# Patient Record
Sex: Female | Born: 1950 | Hispanic: No | Marital: Married | Smoking: Never smoker
Health system: Southern US, Community
[De-identification: ages and names within clinical notes are randomized; demographics above are authoritative.]

## PROBLEM LIST (undated history)

## (undated) DIAGNOSIS — T7840XA Allergy, unspecified, initial encounter: Secondary | ICD-10-CM

## (undated) DIAGNOSIS — E785 Hyperlipidemia, unspecified: Secondary | ICD-10-CM

## (undated) DIAGNOSIS — K219 Gastro-esophageal reflux disease without esophagitis: Secondary | ICD-10-CM

## (undated) DIAGNOSIS — N3010 Interstitial cystitis (chronic) without hematuria: Secondary | ICD-10-CM

## (undated) DIAGNOSIS — G709 Myoneural disorder, unspecified: Secondary | ICD-10-CM

## (undated) DIAGNOSIS — R011 Cardiac murmur, unspecified: Secondary | ICD-10-CM

## (undated) DIAGNOSIS — Q211 Atrial septal defect, unspecified: Secondary | ICD-10-CM

## (undated) DIAGNOSIS — M1990 Unspecified osteoarthritis, unspecified site: Secondary | ICD-10-CM

## (undated) DIAGNOSIS — H5000 Unspecified esotropia: Secondary | ICD-10-CM

## (undated) HISTORY — DX: Atrial septal defect: Q21.1

## (undated) HISTORY — DX: Cardiac murmur, unspecified: R01.1

## (undated) HISTORY — DX: Atrial septal defect, unspecified: Q21.10

## (undated) HISTORY — DX: Interstitial cystitis (chronic) without hematuria: N30.10

## (undated) HISTORY — DX: Myoneural disorder, unspecified: G70.9

## (undated) HISTORY — PX: EYE SURGERY: SHX253

## (undated) HISTORY — DX: Unspecified osteoarthritis, unspecified site: M19.90

## (undated) HISTORY — DX: Allergy, unspecified, initial encounter: T78.40XA

## (undated) HISTORY — DX: Hyperlipidemia, unspecified: E78.5

## (undated) HISTORY — DX: Gastro-esophageal reflux disease without esophagitis: K21.9

---

## 1984-10-06 HISTORY — PX: ASD REPAIR: SHX258

## 1998-04-23 ENCOUNTER — Ambulatory Visit: Admission: RE | Admit: 1998-04-23 | Discharge: 1998-04-23 | Payer: Self-pay | Admitting: Gastroenterology

## 1998-04-25 ENCOUNTER — Other Ambulatory Visit: Admission: RE | Admit: 1998-04-25 | Discharge: 1998-04-25 | Payer: Self-pay | Admitting: Obstetrics & Gynecology

## 1999-05-15 ENCOUNTER — Other Ambulatory Visit: Admission: RE | Admit: 1999-05-15 | Discharge: 1999-05-15 | Payer: Self-pay | Admitting: Obstetrics & Gynecology

## 2000-05-11 ENCOUNTER — Other Ambulatory Visit: Admission: RE | Admit: 2000-05-11 | Discharge: 2000-05-11 | Payer: Self-pay | Admitting: Obstetrics & Gynecology

## 2000-10-06 HISTORY — PX: CERVICAL SPINE SURGERY: SHX589

## 2001-05-11 ENCOUNTER — Other Ambulatory Visit: Admission: RE | Admit: 2001-05-11 | Discharge: 2001-05-11 | Payer: Self-pay | Admitting: Obstetrics & Gynecology

## 2001-08-19 ENCOUNTER — Encounter: Payer: Self-pay | Admitting: Neurosurgery

## 2001-08-20 ENCOUNTER — Ambulatory Visit (HOSPITAL_COMMUNITY): Admission: RE | Admit: 2001-08-20 | Discharge: 2001-08-20 | Payer: Self-pay | Admitting: Neurosurgery

## 2001-08-20 ENCOUNTER — Encounter: Payer: Self-pay | Admitting: Neurosurgery

## 2001-10-04 ENCOUNTER — Encounter: Payer: Self-pay | Admitting: Neurosurgery

## 2001-10-04 ENCOUNTER — Ambulatory Visit (HOSPITAL_COMMUNITY): Admission: RE | Admit: 2001-10-04 | Discharge: 2001-10-04 | Payer: Self-pay | Admitting: Neurosurgery

## 2001-11-01 DIAGNOSIS — K430 Incisional hernia with obstruction, without gangrene: Secondary | ICD-10-CM | POA: Insufficient documentation

## 2002-01-04 ENCOUNTER — Encounter: Admission: RE | Admit: 2002-01-04 | Discharge: 2002-01-04 | Payer: Self-pay | Admitting: Family Medicine

## 2002-01-04 ENCOUNTER — Encounter (INDEPENDENT_AMBULATORY_CARE_PROVIDER_SITE_OTHER): Payer: Self-pay | Admitting: *Deleted

## 2002-01-04 ENCOUNTER — Encounter: Payer: Self-pay | Admitting: Family Medicine

## 2002-01-19 ENCOUNTER — Encounter: Admission: RE | Admit: 2002-01-19 | Discharge: 2002-01-19 | Payer: Self-pay | Admitting: General Surgery

## 2002-01-19 ENCOUNTER — Encounter (INDEPENDENT_AMBULATORY_CARE_PROVIDER_SITE_OTHER): Payer: Self-pay | Admitting: *Deleted

## 2002-01-19 ENCOUNTER — Encounter: Payer: Self-pay | Admitting: General Surgery

## 2002-05-05 ENCOUNTER — Encounter (INDEPENDENT_AMBULATORY_CARE_PROVIDER_SITE_OTHER): Payer: Self-pay | Admitting: Specialist

## 2002-05-05 ENCOUNTER — Ambulatory Visit (HOSPITAL_COMMUNITY): Admission: RE | Admit: 2002-05-05 | Discharge: 2002-05-05 | Payer: Self-pay | Admitting: *Deleted

## 2002-05-05 ENCOUNTER — Encounter (INDEPENDENT_AMBULATORY_CARE_PROVIDER_SITE_OTHER): Payer: Self-pay | Admitting: *Deleted

## 2003-04-17 ENCOUNTER — Other Ambulatory Visit: Admission: RE | Admit: 2003-04-17 | Discharge: 2003-04-17 | Payer: Self-pay | Admitting: Obstetrics & Gynecology

## 2004-04-30 ENCOUNTER — Other Ambulatory Visit: Admission: RE | Admit: 2004-04-30 | Discharge: 2004-04-30 | Payer: Self-pay | Admitting: Obstetrics & Gynecology

## 2004-07-02 ENCOUNTER — Encounter (INDEPENDENT_AMBULATORY_CARE_PROVIDER_SITE_OTHER): Payer: Self-pay | Admitting: *Deleted

## 2004-07-02 ENCOUNTER — Ambulatory Visit (HOSPITAL_BASED_OUTPATIENT_CLINIC_OR_DEPARTMENT_OTHER): Admission: RE | Admit: 2004-07-02 | Discharge: 2004-07-02 | Payer: Self-pay | Admitting: Urology

## 2004-07-02 ENCOUNTER — Ambulatory Visit (HOSPITAL_COMMUNITY): Admission: RE | Admit: 2004-07-02 | Discharge: 2004-07-02 | Payer: Self-pay | Admitting: Urology

## 2005-05-09 ENCOUNTER — Other Ambulatory Visit: Admission: RE | Admit: 2005-05-09 | Discharge: 2005-05-09 | Payer: Self-pay | Admitting: Obstetrics & Gynecology

## 2007-10-01 ENCOUNTER — Encounter: Admission: RE | Admit: 2007-10-01 | Discharge: 2007-10-01 | Payer: Self-pay | Admitting: Family Medicine

## 2007-10-07 HISTORY — PX: CARPAL TUNNEL RELEASE: SHX101

## 2007-12-10 ENCOUNTER — Ambulatory Visit (HOSPITAL_COMMUNITY): Admission: RE | Admit: 2007-12-10 | Discharge: 2007-12-10 | Payer: Self-pay | Admitting: Neurosurgery

## 2008-03-22 ENCOUNTER — Encounter: Admission: RE | Admit: 2008-03-22 | Discharge: 2008-03-22 | Payer: Self-pay | Admitting: Neurosurgery

## 2008-03-24 ENCOUNTER — Encounter: Admission: RE | Admit: 2008-03-24 | Discharge: 2008-03-24 | Payer: Self-pay | Admitting: Neurosurgery

## 2008-10-31 ENCOUNTER — Encounter: Admission: RE | Admit: 2008-10-31 | Discharge: 2009-01-02 | Payer: Self-pay | Admitting: Family Medicine

## 2009-10-06 DIAGNOSIS — N3010 Interstitial cystitis (chronic) without hematuria: Secondary | ICD-10-CM

## 2009-10-06 HISTORY — DX: Interstitial cystitis (chronic) without hematuria: N30.10

## 2010-01-02 ENCOUNTER — Encounter (INDEPENDENT_AMBULATORY_CARE_PROVIDER_SITE_OTHER): Payer: Self-pay | Admitting: *Deleted

## 2010-03-27 ENCOUNTER — Ambulatory Visit: Payer: Self-pay | Admitting: Gastroenterology

## 2010-03-27 DIAGNOSIS — F411 Generalized anxiety disorder: Secondary | ICD-10-CM | POA: Insufficient documentation

## 2010-03-27 DIAGNOSIS — R198 Other specified symptoms and signs involving the digestive system and abdomen: Secondary | ICD-10-CM

## 2010-03-27 DIAGNOSIS — K219 Gastro-esophageal reflux disease without esophagitis: Secondary | ICD-10-CM

## 2010-03-27 DIAGNOSIS — R1031 Right lower quadrant pain: Secondary | ICD-10-CM | POA: Insufficient documentation

## 2010-04-16 ENCOUNTER — Ambulatory Visit: Payer: Self-pay | Admitting: Gastroenterology

## 2010-04-19 ENCOUNTER — Encounter: Payer: Self-pay | Admitting: Gastroenterology

## 2010-11-07 NOTE — Letter (Signed)
Summary: [facility] Instructions  [facility]  [address] [place], [place] [address]   Phone: [phone]  Fax: [phone]       Deborah Archer    14-Jul-1951    MRN: [id number]        Procedure Day /Date: 04/01/10 Monday     Arrival Time: 1:30 pm     Procedure Time: 2:30 pm     Location of Procedure:                    _x_  [facility] (4th Floor)  PREPARATION FOR COLONOSCOPY WITH MOVIPREP   Starting 5 days prior to your procedure (03/27/10) do not eat nuts, seeds, popcorn, corn, beans, peas,  salads, or any raw vegetables.  Do not take any fiber supplements (e.g. Metamucil, Citrucel, and Benefiber).  THE DAY BEFORE YOUR PROCEDURE         DATE: 03/31/10  DAY: Sunday  1.  Drink clear liquids the entire day-NO SOLID FOOD  2.  Do not drink anything colored red or purple.  Avoid juices with pulp.  No orange juice.  3.  Drink at least 64 oz. (8 glasses) of fluid/clear liquids during the day to prevent dehydration and help the prep work efficiently.  CLEAR LIQUIDS INCLUDE: Water Jello Ice Popsicles Tea (sugar ok, no milk/cream) Powdered fruit flavored drinks Coffee (sugar ok, no milk/cream) Gatorade Juice: apple, white grape, white cranberry  Lemonade Clear bullion, consomm, broth Carbonated beverages (any kind) Strained chicken noodle soup Hard Candy                             4.  In the morning, mix first dose of MoviPrep solution:    Empty 1 Pouch A and 1 Pouch B into the disposable container    Add lukewarm drinking water to the top line of the container. Mix to dissolve    Refrigerate (mixed solution should be used within 24 hrs)  5.  Begin drinking the prep at 5:00 p.m. The MoviPrep container is divided by 4 marks.   Every 15 minutes drink the solution down to the next mark (approximately 8 oz) until the full liter is complete.   6.  Follow completed prep with 16 oz of clear liquid of your choice (Nothing red or purple).   Continue to drink clear liquids until bedtime.  7.  Before going to bed, mix second dose of MoviPrep solution:    Empty 1 Pouch A and 1 Pouch B into the disposable container    Add lukewarm drinking water to the top line of the container. Mix to dissolve    Refrigerate  THE DAY OF YOUR PROCEDURE      DATE: 04/01/10 DAY: Monday  Beginning at 9:30 a.m. (5 hours before procedure):         1. Every 15 minutes, drink the solution down to the next mark (approx 8 oz) until the full liter is complete.  2. Follow completed prep with 16 oz. of clear liquid of your choice.    3. You may drink clear liquids until 12:30 pm (2 HOURS BEFORE PROCEDURE).   MEDICATION INSTRUCTIONS  Unless otherwise instructed, you should take regular prescription medications with a small sip of water   as early as possible the morning of your procedure.          OTHER INSTRUCTIONS  You will need a responsible adult at least 61  years of age to accompany you and drive you home.   This person must remain in the waiting room during your procedure.  Wear loose fitting clothing that is easily removed.  Leave jewelry and other valuables at home.  However, you may wish to bring a book to read or  an iPod/MP3 player to listen to music as you wait for your procedure to start.  Remove all body piercing jewelry and leave at home.  Total time from sign-in until discharge is approximately 2-3 hours.  You should go home directly after your procedure and rest.  You can resume normal activities the  day after your procedure.  The day of your procedure you should not:   Drive   Make legal decisions   Operate machinery   Drink alcohol   Return to work  You will receive specific instructions about eating, activities and medications before you leave.    The above instructions have been reviewed and explained to me by  Lamona Curl CMA Duncan Dull)  March 27, 2010 12:08 PM     I fully understand and can  verbalize these instructions _____________________________ Date 03/27/10

## 2010-11-07 NOTE — Assessment & Plan Note (Signed)
Summary: discuss colon--ch.   History of Present Illness Visit Type: Initial Visit Primary GI MD: Elie Goody MD [facility] Primary Provider: Shaune Pollack, MD Chief Complaint: Patient here to establish GI care her last ECL was with Dr. Luther Parody at [facility] in 2003. She had some RLQ pain which has since resolved. She also has hemorrhoids that flare up from time to time. She notes a change in her BMs.  History of Present Illness:   This is a 60 year old female, who relates a lifelong history of a "sensitive stomach". She states stress and certain foods lead to multiple urgent bowel movements and for several weeks she has had more urgent and frequent bowel movements, along with hemorrhoidal swelling and discomfort. She feels she is under no more stress than usual but was under more stress when the change first occured.  She had right lower quadrant pain that lasted for a few week and radiated around her right flank into her back that resolved several weeks ago. She has since been evaluated by Dr. Marcelyn Bruins at [facility] and he felt she was having a flare of her interstitial cystitis that lead to RLQ pain.  She has chronic GERD that is under good control on Prevacid OTC. She underwent colonoscopy in July 2003 that was normal. She underwent upper endoscopy in July 2003, that showed only a benign fundic gland polyp.   GI Review of Systems    Reports abdominal pain and  acid reflux.     Location of  Abdominal pain: RLQ.    Denies belching, bloating, chest pain, dysphagia with liquids, dysphagia with solids, heartburn, loss of appetite, nausea, vomiting, vomiting blood, weight loss, and  weight gain.      Reports change in bowel habits, constipation, and  hemorrhoids.     Denies anal fissure, black tarry stools, diarrhea, diverticulosis, fecal incontinence, heme positive stool, irritable bowel syndrome, jaundice, light color stool, liver problems, rectal bleeding, and  rectal pain. Preventive  Screening-Counseling & Management  Alcohol-Tobacco     Smoking Status: never      Drug Use:  no.     Current Medications (verified): 1)  Valium 5 Mg Tabs (Diazepam) .... Take 1/2 Tablet By Mouth At Bedtime 2)  Prevacid 24hr 15 Mg Cpdr (Lansoprazole) .... Take One By Mouth As Needed 3)  Tums Smoothies 750 Mg Chew (Calcium Carbonate Antacid) .... Take Up To 6 By Mouth As Needed 4)  Prelief 65-340-50 Mg Tabs (Calcium Glycerophosphate) .... Take 2-4 With Meals As Needed 5)  Tylenol 325 Mg Tabs (Acetaminophen) .... Take One- Two Tabs By Mouth Once Daily 6)  Alka-Seltzer Antacid 500 Mg Caps (Calcium Carbonate Antacid) .... Take Two By Mouth As Needed  Allergies (verified): 1)  ! Amoxicillin 2)  ! 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  Alcohol Use - yes wine cooler every 6 months Daily Caffeine Use 2-3 glasses tea or soda a day Illicit Drug Use - no Smoking Status:  never Drug Use:  no  Review of Systems       The patient complains of anxiety-new, change in vision,  urination - excessive, and urine leakage.         The pertinent positives and negatives are noted as above and in the HPI. All other ROS were reviewed and were negative.   Vital Signs:  Patient profile:   60 year old female Height:      63.5 inches Weight:      170.4 pounds BMI:     29.82 Pulse rate:   80 / minute Pulse rhythm:   regular BP sitting:   122 / 80  (left arm) Cuff size:   regular  Vitals Entered By: Harlow Mares CMA Duncan Dull) (March 27, 2010 11:36 AM)  Physical Exam  General:  Well developed, well nourished, no acute distress. Head:  Normocephalic and atraumatic. Eyes:  PERRLA, no icterus. Ears:  Normal auditory acuity. Mouth:  No deformity or lesions, dentition normal. Neck:  Supple; no masses or thyromegaly. Lungs:  Clear throughout to auscultation. Heart:  Regular rate and rhythm; no murmurs, rubs,  or bruits. Abdomen:  Soft, nontender and nondistended. No masses, hepatosplenomegaly or hernias noted. Normal bowel sounds. Rectal:  deferred until time of colonoscopy.   Msk:  Symmetrical with no gross deformities. Normal posture. Pulses:  Normal pulses noted. Extremities:  No clubbing, cyanosis, edema or deformities noted. Neurologic:  Alert and  oriented x4;  grossly normal neurologically. Cervical Nodes:  No significant cervical adenopathy. Inguinal Nodes:  No significant inguinal adenopathy. Psych:  Alert and cooperative. Normal mood and anxious.    Impression & Recommendations:  Problem # 1:  CHANGE IN BOWELS (ICD-787.99) Change in bowel habits. I suspect she has irritable bowel syndrome. Given her recent right lower quadrant pain and persistent change in bowel habits we need to exclude inflammatory bowel disease and colorectal neoplasms. The risks, benefits and alternatives to colonoscopy with possible biopsy and possible polypectomy were discussed with the patient and they consent to proceed. The procedure will be scheduled electively.  Problem # 2:  ABDOMINAL PAIN-RLQ (ICD-789.03) Symptoms have resolved. I suspect they were related to interstitial cystitis, but gastrointestinal causes will be further evaluated, as above.  Problem # 3:  GERD (ICD-530.81) Continue standard antireflux measures and Prevacid 15 mg q.a.m.  Problem # 4:  ANXIETY (ICD-300.00) Further followup and management per Dr. Kevan Ny.  Other Orders: Colonoscopy (Colon)  Patient Instructions: 1)  You have been scheduled for a colonoscopy on  04/01/10 @ 2:30 pm. Please arrive at 1:30 pm for registration. 2)  Please pick up your Moviprep prescription at the pharmacy. We have sent this prescription electronically. 3)  Avoid foods high in acid content ( tomatoes, citrus juices, spicy foods) . Avoid eating within 3 to 4 hours of lying down or before exercising. Do not over eat; try smaller more frequent meals. Elevate head of bed four inches when sleeping.  4)  Antireflux Diet Handout Given. 5)  Copy sent to : Dr Shaune Pollack 6)  The medication list was reviewed and reconciled.  All changed / newly prescribed medications were explained.  A complete medication list was provided to the patient / caregiver. Prescriptions: MOVIPREP 100 GM  SOLR (PEG-KCL-NACL-NASULF-NA ASC-C) As per prep instructions.  #1 x 0   Entered by:   Lamona Curl CMA (AAMA)   Authorized by:   Meryl Dare MD [facility]   Signed by:   Lamona Curl CMA (AAMA) on 03/27/2010   Method used:   Electronically to        [facility]  [address] [phone]* (retail)  [address]       [place], [place]  [address]       Ph: [phone] or [phone]       Fax: [phone]   RxID:   [id number]

## 2010-11-07 NOTE — Procedures (Signed)
Summary: Colonoscopy  Patient: Deborah Archer Note: All result statuses are Final unless otherwise noted.  Tests: (1) Colonoscopy (COL)   COL Colonoscopy           DONE     [facility]     [address]     [place], [place]  [address]           COLONOSCOPY PROCEDURE REPORT           PATIENT:  Deborah Archer, Deborah Archer  MR#:  [id number]     BIRTHDATE:  12-20-1950, 59 yrs. old  GENDER:  female     ENDOSCOPIST:  Judie Petit T. Russella Dar, MD, [facility]           PROCEDURE DATE:  04/16/2010     PROCEDURE:  Colonoscopy with biopsy and snare polypectomy     ASA CLASS:  Class II     INDICATIONS:  1) change in bowel habits  2) Abdominal pain-RLQ     pain.     MEDICATIONS:   Fentanyl 100 mcg IV, Versed 10 mg IV     DESCRIPTION OF PROCEDURE:   After the risks benefits and     alternatives of the procedure were thoroughly explained, informed     consent was obtained.  Digital rectal exam was performed and     revealed no abnormalities.   The LB [id number]-[id number] endoscope     was introduced through the anus and advanced to the terminal ileum     which was intubated for a short distance, without limitations.     The quality of the prep was excellent, using MoviPrep.  The     instrument was then slowly withdrawn as the colon was fully     examined.     <<PROCEDUREIMAGES>>     FINDINGS:  A sessile polyp was found in the ascending colon. It     was 4 mm in size. The polyp was removed using cold biopsy forceps.     A sessile polyp was found in the proximal transverse colon. It was     5 mm in size. Polyp was snared without cautery. Retrieval was     successful. A sessile polyp was found in the rectum. It was 5 mm     in size. Polyp was snared without cautery. Retrieval was     successful. Normal terminal ileum. This was otherwise a normal     examination of the colon. Retroflexed views in the rectum revealed     internal hemorrhoids, small.  The time to cecum =  2  minutes. The     scope was then  withdrawn (time =  10.5  min) from the patient and     the procedure completed.           COMPLICATIONS:  None           ENDOSCOPIC IMPRESSION:     1) 4 mm sessile polyp in the ascending colon     2) 5 mm sessile polyp in the proximal transverse colon     3) 5 mm sessile polyp in the rectum     4) Internal hemorrhoids           RECOMMENDATIONS:     1) Await pathology results     2) If the polyps removed today are adenomatous (pre-cancerous),     you will need a repeat colonoscopy in 5 years. Otherwise you     should continue to follow colorectal cancer screening guidelines  for "routine risk" patients with colonoscopy in 10 years.     Venita Lick. Russella Dar, MD, [facility]           CC: Shaune Pollack, MD           n.     Rosalie DoctorVenita Lick. Malaak Stach at 04/16/2010 03:11 PM           Janit Bern, [id number]  Note: An exclamation mark (!) indicates a result that was not dispersed into the flowsheet. Document Creation Date: 04/16/2010 3:12 PM _______________________________________________________________________  (1) Order result status: Final Collection or observation date-time: 04/16/2010 15:03 Requested date-time:  Receipt date-time:  Reported date-time:  Referring Physician:   Ordering Physician: Claudette Head [phone]) Specimen Source:  Source: Launa Grill Order Number: [id number] Lab site:   Appended Document: Colonoscopy     Procedures Next Due Date:    Colonoscopy: 04/2015

## 2010-11-07 NOTE — Letter (Signed)
Summary: Patient Notice- Polyp Results  [facility]  [address] [place], [place] [address]   Phone: [phone]  Fax: [phone]        April 19, 2010 MRN: [id number]    [facility] [address] [place], [place]  [address]    Dear Ms. Kulaga,  I am pleased to inform you that the colon polyp(s) removed during your recent colonoscopy was (were) found to be benign (no cancer detected) upon pathologic examination.  I recommend you have a repeat colonoscopy examination in 5 years to look for recurrent polyps, as having colon polyps increases your risk for having recurrent polyps or even colon cancer in the future.  Should you develop new or worsening symptoms of abdominal pain, bowel habit changes or bleeding from the rectum or bowels, please schedule an evaluation with either your primary care physician or with me.  Continue treatment plan as outlined the day of your exam.  Please call us if you are having persistent problems or have questions about your condition that have not been fully answered at this time.  Sincerely,  Meryl Dare MD [facility]  This letter has been electronically signed by your physician.  Appended Document: Patient Notice- Polyp Results letter mailed.

## 2010-11-07 NOTE — Letter (Signed)
Summary: New Patient letter  [facility]  [address] [place], [place] [address]   Phone: [phone]  Fax: [phone]       01/02/2010 MRN: [id number]  [facility] [address] [place], [place]  [address]  Dear Deborah Archer,  Welcome to the Gastroenterology Division at [facility].    You are scheduled to see Dr. Russella Dar on 02-07-10 at 10:00a.m. on the 3rd floor at [facility], [address]  We ask that you try to arrive at our office 15 minutes prior to your appointment time to allow for check-in.  We would like you to complete the enclosed self-administered evaluation form prior to your visit and bring it with you on the day of your appointment.  We will review it with you.  Also, please bring a complete list of all your medications or, if you prefer, bring the medication bottles and we will list them.  Please bring your insurance card so that we may make a copy of it.  If your insurance requires a referral to see a specialist, please bring your referral form from your primary care physician.  Co-payments are due at the time of your visit and may be paid by cash, check or credit card.     Your office visit will consist of a consult with your physician (includes a physical exam), any laboratory testing he/she may order, scheduling of any necessary diagnostic testing (e.g. x-ray, ultrasound, CT-scan), and scheduling of a procedure (e.g. Endoscopy, Colonoscopy) if required.  Please allow enough time on your schedule to allow for any/all of these possibilities.    If you cannot keep your appointment, please call [phone] to cancel or reschedule prior to your appointment date.  This allows [place] the opportunity to schedule an appointment for another patient in need of care.  If you do not cancel or reschedule by 5 p.m. the business day prior to your appointment date, you will be charged a $50.00 late cancellation/no-show fee.    Thank you for choosing  [facility] for your medical needs.  We appreciate the opportunity to care for you.  Please visit [place] at our website  to learn more about our practice.                     Sincerely,                                                             The Gastroenterology Division

## 2010-11-07 NOTE — Procedures (Signed)
Summary: COLON (Dr Luther Parody)   Colonoscopy  Procedure date:  05/05/2002  Findings:      Location:  [facility].    Procedures Next Due Date:    Colonoscopy: 05/2012 Patient Name: Deborah Archer, Deborah Archer. MRN: [id number] Procedure Procedures: Colonoscopy CPT: [phone].  Personnel: Endoscopist: Roosvelt Harps, MD.  Referred By: Duncan Dull, MD.  Exam Location: Exam performed in Endoscopy Suite. Outpatient  Patient Consent: Procedure, Alternatives, Risks and Benefits discussed, consent obtained, from patient. Consent was obtained by the RN. Consent to be contacted was not given.  Indications  Average Risk Screening Routine.  History Allergies: Allergic to Amox.  Patient Habits Patient does not smoke. Drinking Status: not currently drinking.  Pre-Exam Physical: Cardio-pulmonary exam abnormal. Rectal exam, HEENT exam  WNL. Abdominal exam abnormal. Extremity exam, Neurological exam, Mental status exam WNL. Abnormal PE findings include: 2/6 syst m., incisional ventral hernia.  Exam Exam: Extent of exam reached: Cecum, extent intended: Cecum.  The cecum was identified by appendiceal orifice and IC valve. Patient position: on left side. Colon retroflexion performed. Images taken. ASA Classification: II. Tolerance: excellent.  Monitoring: Pulse and BP monitoring, Oximetry used. Supplemental O2 given.  Colon Prep Used Phospho Soda for colon prep. Prep results: excellent.  Fluoroscopy: Fluoroscopy was not used.  Sedation Meds: Patient assessed and found to be appropriate for moderate (conscious) sedation. Sedation was managed by the Endoscopist. Residual sedation present from prior procedure today.  Demerol 20 mg. given IV. Versed 2 mg. given IV.  Findings IMAGE TAKEN: Ascending Colon.  Image #2 attached.  Comments:  Normal.  IMAGE TAKEN: Cecum.  Image #1 attached.  Comments:  Normal.  IMAGE TAKEN: Sigmoid Colon.  Image #3 attached.  Comments:  Normal.  IMAGE  TAKEN: Rectum.  Image #4 attached.  Comments:  Normal.   Assessment Normal examination.  Events  Unplanned Interventions: No intervention was required.  Unplanned Events: There were no complications. Plans  Post Exam Instructions: Post sedation instructions given.  Medication Plan: Continue current medications.  Patient Education: Patient given standard instructions for: a normal exam. Patient instructed to get routine colonoscopy every 10 years.  Disposition: After procedure patient sent to recovery. After recovery patient sent home.  Scheduling/Referral: Follow-Up prn.   This report was created from the original endoscopy report, which was reviewed and signed by the above listed endoscopist.

## 2010-11-07 NOTE — Procedures (Signed)
Summary: EGD (Dr Luther Parody)   EGD  Procedure date:  05/05/2002  Findings:      Location: [facility]   Patient Name: Deborah Archer, Deborah Archer. MRN: [id number] Procedure Procedures: Panendoscopy (EGD) CPT: 43235.    with biopsy(s)/brushing(s). CPT: [id number].  Personnel: Endoscopist: Roosvelt Harps, MD.  Referred By: Duncan Dull, MD.  Exam Location: Exam performed in Endoscopy Suite. Outpatient  Patient Consent: Procedure, Alternatives, Risks and Benefits discussed, consent obtained, from patient. Consent was obtained by the RN. Consent to be contacted was not given.  Indications Symptoms: Dysphagia. Abdominal pain, location: epigastric. Reflux symptoms  History Allergies: Patient is allergic to Amox.  Patient Habits Patient does not smoke. Drinking Status: not currently drinking.  Pre-Exam Physical: Cardio-pulmonary exam abnormal. HEENT exam WNL. Abdominal exam abnormal. Extremity exam, Neurological exam, Mental status exam WNL. Abnormal PE findings include: 2/6 syst m., incisional ventral hernia.  Exam Exam Info: Maximum depth of insertion Duodenum, intended Duodenum. Patient position: on left side. Vocal cords not visualized. Gastric retroflexion performed. Images taken. ASA Classification: II. Tolerance: excellent.  Sedation Meds: Patient assessed and found to be appropriate for moderate (conscious) sedation. Sedation was managed by the Endoscopist. Cetacaine Spray 2 sprays given aerosolized. Demerol 80 mg. given IV. Versed 8 mg. given IV.  Monitoring: BP and pulse monitoring done. Oximetry used. Supplemental O2 given  Fluoroscopy: Fluoroscopy was not used.  Findings IMAGE TAKEN: in Distal Esophagus. Image #4 attached. Comments: Normal Z line, view of fundic polyp, biopsied.  IMAGE TAKEN: in Cardia. Image #3 attached. Comments: Mild laxity LES.  IMAGE TAKEN: in Duodenal Bulb. Image #1 attached. Comments: Normal.  DIAGNOSTIC TEST: from Antrum. RUT done,  results pending  IMAGE TAKEN: in Duodenal 2nd Portion. Image #2 attached. Comments: Normal.   Assessment Abnormal examination, see findings above.  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   Events  Unplanned Intervention: No unplanned interventions were required.  Unplanned Events: There were no complications. Plans Medication(s): Continue current medications.  Patient Education: Patient given standard instructions for: Reflux.  Disposition: After procedure patient sent to recovery. After recovery patient sent home.  Scheduling: Await pathology to schedule patient. Follow-up prn.   This report was created from the original endoscopy report, which was reviewed and signed by the above listed endoscopist.

## 2011-02-18 NOTE — Op Note (Signed)
Deborah Archer, Deborah Archer              ACCOUNT NO.:  [id number]   MEDICAL RECORD NO.:  [id number]          PATIENT TYPE:  AMB   LOCATION:  SDS                          FACILITY:  [facility]   PHYSICIAN:  Henry A. Pool, M.D.    DATE OF BIRTH:  08-25-1951   DATE OF PROCEDURE:  12/10/2007  DATE OF DISCHARGE:                               OPERATIVE REPORT   PREOPERATIVE DIAGNOSIS:  Right carpal tunnel syndrome.   POSTOPERATIVE DIAGNOSIS:  Right carpal tunnel syndrome.   PROCEDURE NOTE:  Right carpal tunnel release.   SURGEON:  Kathaleen Maser. Pool, M.D.   ANESTHESIA:  Regional Bier block with IV sedation.   INDICATIONS:  Ms. Heslin is a 60 year old female with a history of  right hand paresthesias, numbness and pain consistent with carpal tunnel  syndrome, failing conservative management.  Workup demonstrates evidence  of significant swelling of her median nerve at her wrist documented by  EMGs and nerve conduction studies.  I discussed the situation with the  patient.  She decided to proceed with carpal tunnel release in hopes  improving her symptoms.   OPERATION:  The patient was brought to the operating room and placed on  the operating table in a supine position.  After an adequate level of  anesthesia was achieved, the patient was positioned supine with her  right arm extended.  The patient's hand and forearm prepped and draped  sterilely.  A 15 blade was used to make a linear incision along the mid  palmar crease just distal to the distal wrist fold.  This was carried  down sharply through the palmar fascia.  The transverse carpal ligament  was identified.  This was then divided exposing the underlying median  nerve.  The median nerve was protected using a Therapist, nutritional and the  transverse carpal ligament was then divided distally into the palm until  the [place] elevator passed easily and there was no evidence of any  residual compression.  The [place] elevator was then redirected  proximally  along the course of the nerve.  The proximal aspect of the transverse  carpal ligament was then divided using a 15 blade and later tenotomy  scissors.  This decompression carried up into the forearm.  At this  point, all the compression upon the median nerve had been relieved.  There was no evidence of injury to the median nerve.  The wound was  irrigated with antibiotic solution.  Hemostasis was ensured with bipolar  cautery.  The wound was then closed in typical fashion.  A sterile  dressing was applied.  There were no operative complications.  The  patient tolerated the procedure well and she returns to the recovery  room postoperatively.           ______________________________  Kathaleen Maser Pool, M.D.     HAP/MEDQ  D:  12/10/2007  T:  12/10/2007  Job:  [id number]

## 2011-02-21 NOTE — Op Note (Signed)
Deborah Archer, Deborah Archer              ACCOUNT NO.:  [id number]   MEDICAL RECORD NO.:  [id number]          PATIENT TYPE:  AMB   LOCATION:  [facility]                         FACILITY:  [facility]   PHYSICIAN:  Jamison Neighbor, M.D.  DATE OF BIRTH:  [birth year]   DATE OF PROCEDURE:  07/02/2004  DATE OF DISCHARGE:                                 OPERATIVE REPORT   PREOPERATIVE DIAGNOSES:  1.  Right flank pain.  2.  Microscopic hematuria.  3.  Interstitial cystitis.   POSTOPERATIVE DIAGNOSES:  1.  Right flank pain.  2.  Microscopic hematuria.  3.  Interstitial cystitis.   PROCEDURE:  Cystoscopy, urethral calibration, hydrodistention of the  bladder, right retrograde, bladder biopsy with cauterization, Marcaine and  Pyridium instillation, Marcaine and Kenalog injection.   SURGEON:  Jamison Neighbor, M.D.   ANESTHESIA:  General.   COMPLICATIONS:  None.   DRAINS:  None.   BRIEF HISTORY:  This 60 year old female has had problems with urgency,  frequency, and urinary incontinence.  The patient has had some degree of  pelvic pain as well.  The patient has a daughter with known interstitial  cystitis, and the patient suspects that she might have this condition.  The  patient has tried Detrol LA and Oxytrol patches without much improved.  She  has tried a high dose of Enablex at 15 mg and feels that may have helped but  does find a dry mouth somewhat difficult to tolerate.  She has complained of  right-sided flank pain.  She underwent a CT scan which revealed upper tracts  bilaterally.  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 Arlyce Dice  is not a new finding.  The patient had nothing that would explain her right-  sided pain.  She is now to undergo cystoscopy and hydrodistention to  determine if she has interstitial cystitis and retrograde study on the right-  hand side as part of an evaluation of right flank  pain.  She understands the  risks and benefits of the procedure and gave full, informed consent.   PROCEDURE:  After successful induction of general anesthesia, the patient is  placed in a dorsal lithotomy position and prepped with Betadine, draped in  the usual sterile fashion.  Careful bimanual examination revealed no  significant cystocele, rectocele, or enterocele.  The urethra was  unremarkable in its appearance with no signs of a diverticulum.  The urethra  was easily calibrated to a 48 [place] with ___________ urethral sounds with  no evidence of stenosis or stricture.  The cystoscope was inserted.  The  bladder was carefully inspected.  It was free of any tumors or stones.  Both  ureteral orifices were identified.  They were normal in location, did appear  slightly stenotic and were not difficult to cannulate.  On the right-hand  side, a guidewire was passed into the ureter and then the ureteral catheter  was inserted.  A retrograde study performed showed unremarkable upper tracts  with no evidence of filling defect,  obstruction, or any other abnormalities.  The patient has had no symptoms on the left-hand side, so retrograde was not  done on that side.  Hydrodistention of the bladder was then performed.  The  bladder was filled to a pressure of 100 cm of water for five minutes.  When  the bladder was drained, there was a minimal amount of glomerulation  formation.  There were no ulcers seen.  The bladder capacity was diminished  at 600 cc.  This compares to an average bladder capacity of 1150 for normal  patients and 575 for interstitial cystitis.  Given the lack of ulceration  and glomerulations, it is difficult to consider this as a classic form of  interstitial cystitis, but obviously there is significant decrease in  bladder capacity, and that remains as the most likely diagnosis.  A biopsy  was performed that will be sent for mast cell analysis.  That site was  cauterized.  A  mixture of Marcaine and Pyridium was left in the bladder.  Marcaine and Kenalog were injected periurethrally.  The patient tolerated  the procedure well and was taken to the recovery room in good condition.  She received intraoperative Toradol and Zofran as well as a B&O suppository.  She will be sent home with Lorcet Plus, Pyridium Plus, and doxycycline and  will return to see me in followup in 2-3 weeks time.  The decision as to the  best way to proceed will depend greatly on the patient's response to the  hydrodistention.  If the patient has significant improvement, she will be  given a definite diagnosis of interstitial cystitis and will be started on  an Elmiron-based protocol to consist of instillation therapy utilizing the  anesthetic cocktails of heparin, lidocaine, and bicarb along with a  combination of Elmiron, Atarax, and a tricyclic antidepressant.  If, on the  other hand, there is no improvement whatsoever, one would have to consider  this is simply a case of severe urgency incontinence and overactive bladder,  and if she continues to fail to respond to any type of anticholinergic  therapy, it is likely that she will be offered Interstim implantation or  Botox injections.      RJE/MEDQ  D:  07/02/2004  T:  07/02/2004  Job:  [id number]   cc:   Madaline Savage, M.D.  [address]  [place]  [place] [address]  Fax: [phone]   Ilda Mori, M.D.  [address]  [place], [place] [address]  Fax: [phone]   Duncan Dull, M.D.  [address]  [place]  [place] [address]  Fax: [phone]

## 2011-02-21 NOTE — Op Note (Signed)
[facility]  Patient:    ELKY, FUNCHES Visit Number: [id number] MRN: [id number]          Service Type: DSU Location: [id number] Attending Physician:  Donn Pierini Dictated by:   Julio Sicks, M.D. Proc. Date: 08/20/01 Admit Date:  08/20/2001                             Operative Report  PREOPERATIVE DIAGNOSIS:  Right C4-5 herniated nucleus pulposus with radiculopathy.  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  OPERATION PERFORMED:  C4-5 anterior cervical diskectomy and fusion with allograft and anterior plating.  SURGEON:  Julio Sicks, M.D.  ASSISTANT:  Donalee Citrin, Montez Hageman.  ANESTHESIA:  General endotracheal.  INDICATIONS FOR PROCEDURE:  The patient is a 60 year old female with a history of acute neck and right upper extremity pain consistent with a right-sided C5 radiculopathy.  The patient has marked weakness of her deltoid, supraspinatus, infraspinatus muscles as well as some weakness of her biceps muscle.  The patient has failed conservative management and is quite miserable from her pain.  A plain CT scan demonstrates a large rightward C4-5 disk herniation causing compression of the spinal cord and exiting right-sided C5 nerve root. We discussed options available for management.  The patient decided to proceed with a C4-5 anterior cervical diskectomy and fusion with allograft and anterior plating for hopeful improvement in her symptoms.  DESCRIPTION OF PROCEDURE:  The patient was taken to the operating room and placed on the operating table in supine position.  After an adequate level of anesthesia was achieved, the patient was positioned supine with the neck slightly extended and held in place with halter traction.  The patients anterior cervical region was shaved and prepped sterilely.  A 10 blade was used to make a linear incision overlying the C4-5 interspace.  This was carried  down sharply to the platysma.  The platysma was then divided vertically and dissection proceeded along the medial border of the sternocleidomastoid muscle and carotid sheath.  The trachea and esophagus were mobilized and retracted towards the left.  Prevertebral fascia was stripped off the anterior spinal column.  The longus colli muscles were then elevated bilaterally using electrocautery.  Deep self-retaining retractor was placed. Intraoperative fluoroscopy was used and the level was confirmed.  The disk space was then incised with a 15 blade in a rectangular fashion.  A wide disk space clean-out was then achieved using pituitary rongeurs, forward and backward angled Carlens curets, Kerrison rongeurs and a high speed drill.  All elements of the disk were removed down to the posterior annulus.  Microscope was brought into the field and used throughout the remainder of the diskectomy.  The remaining aspects of annulus and osteophytes were removed down to the level of the posterior longitudinal ligament.  The posterior longitudinal ligament was then elevated and resected in piecemeal fashion using Kerrison rongeurs.  The underlying thecal sac was identified.  A wide central decompression was then performed using Kerrison rongeurs to undercut the bodies of C4 and C5.  Decompression was then proceeded out to the left sided C5 foramen.  The C5 nerve root was identified proximally and found to be free of compression.  Decompression then proceeded out to the right sided C5 foramen.  A large amount of subligamentous disk herniation was encountered. This was dissected with micronerve hooks and I completely resected.  A wide anterior  foraminotomy was performed on the right side exposing the right-sided C5 nerve root within its foramen.  There was no evidence of any continued compression.  There was no evidence of any continued disk herniation.  The wound was then copiously irrigated with antibiotic  solution.  Gelfoam was placed topically for hemostasis which was found to be good.  The disk space was then distracted and a 7 mm patellar wedge allograft was impacted into place and recessed approximately 1 mm from the anterior cortical surface.  The microscope was removed.  A 23 mm Atlantis anterior cervical plate was then placed over the C4 and C5 levels.  It was then attached under fluoroscopic guidance by using two 13 mm variable angle screws at C4 and at C5.  All four screws were given a final tightening and found to be solidly within bone. Locking screws were engaged at both levels.  Final images revealed good position of the bone grafts and hardware at the proper operative level with normal alignment of the spine.  Retractor system was removed.  Hemostasis which achieved with bipolar electrocautery.  The wound was then irrigated one final time.  Hemostasis was ensured to be good.  The wound was then closed in a routine fashion.  Steri-Strips and sterile dressing were applied. There were no apparent complications.   The patient tolerated the procedure well and she returned to the recovery room postoperatively. Dictated by:   Julio Sicks, M.D. Attending Physician:  Donn Pierini DD:  08/20/01 TD:  08/20/01 Job: [id number]

## 2011-06-27 LAB — BASIC METABOLIC PANEL
Chloride: 104
GFR calc non Af Amer: >60
Glucose, Bld: 96
Potassium: 4.2
Sodium: 138

## 2011-06-27 LAB — CBC
HCT: 41.7
Hemoglobin: 14.1
MCV: 89.3
WBC: 9.5

## 2013-01-19 ENCOUNTER — Ambulatory Visit (INDEPENDENT_AMBULATORY_CARE_PROVIDER_SITE_OTHER): Payer: BC Managed Care – PPO | Admitting: Physician Assistant

## 2013-01-19 VITALS — BP 125/73 | HR 80 | Temp 98.4°F | Resp 18 | Ht 62.5 in | Wt 171.0 lb

## 2013-01-19 DIAGNOSIS — T148 Other injury of unspecified body region: Secondary | ICD-10-CM

## 2013-01-19 DIAGNOSIS — R21 Rash and other nonspecific skin eruption: Secondary | ICD-10-CM

## 2013-01-19 DIAGNOSIS — W57XXXA Bitten or stung by nonvenomous insect and other nonvenomous arthropods, initial encounter: Secondary | ICD-10-CM

## 2013-01-19 MED ORDER — DOXYCYCLINE HYCLATE 100 MG PO CAPS: 100.0000 mg | ORAL_CAPSULE | Freq: Two times a day (BID) | ORAL | Status: DC

## 2013-01-19 MED ORDER — ONDANSETRON 4 MG PO TBDP: 4.0000 mg | ORAL_TABLET | Freq: Three times a day (TID) | ORAL | Status: DC | PRN

## 2013-01-19 NOTE — Progress Notes (Signed)
Patient ID: Deborah Archer MRN: [id number], DOB: Jan 03, 1951, 62 y.o. Date of Encounter: 01/19/2013, 7:39 PM  Primary Physician: No primary provider on file.  Chief Complaint: Tick bite   HPI: 62 y.o. female presents with tick bite along the left anterior leg. Patient first noticed the tick on 01/16/13. Tick was removed on 01/16/13 in total. She brings the tick in for evaluation today. She knows that the tick was not present on 01/15/13 as she shaved her legs that day. On 01/15/13 she had bene doing some yard work clearing some leaves and brush. Afebrile. No chills. No myalgias or arthralgias. The previous night she did develop a small bull's eye rash around the bite location causing her to come in for evaluation this evening.   Unfortunately she states that her daughter has been battling chronic Lyme disease for 12 years now and the patient is the sole caregiver and provider for the family. She is very emotional with the above. She requests treatment.     Past Medical History  Diagnosis Date  . GERD (gastroesophageal reflux disease)   . Neuromuscular disorder   . Heart murmur      Home Meds: Prior to Admission medications   Medication Sig Start Date End Date Taking? Authorizing Provider  lansoprazole (PREVACID) 15 MG capsule Take 15 mg by mouth daily.   Yes Historical Provider, MD                  Allergies:  Allergies  Allergen Reactions  . Amoxicillin   . Nsaids     History   Social History  . Marital Status: Married    Spouse Name: N/A    Number of Children: N/A  . Years of Education: N/A   Occupational History  . Not on file.   Social History Main Topics  . Smoking status: Never Smoker   . Smokeless tobacco: Not on file  . Alcohol Use: No  . Drug Use: No  . Sexually Active: Yes    Birth Control/ Protection: None   Other Topics Concern  . Not on file   Social History Narrative  . No narrative on file     Review of Systems: Constitutional: negative for  chills, fever, night sweats, weight changes, or fatigue  Cardiovascular: negative for chest pain or palpitations Respiratory: negative for hemoptysis, wheezing, shortness of breath, or cough Abdominal: negative for abdominal pain, nausea, vomiting, or diarrhea Musculoskeletal: negative for arthralgias, myalgias, pain, and decreased ROM Dermatological: positive for rash Neurologic: negative for headache, dizziness, or syncope   Physical Exam: Blood pressure 125/73, pulse 80, temperature 98.4 F (36.9 C), temperature source Oral, resp. rate 18, height 5' 2.5" (1.588 m), weight 171 lb (77.565 kg)., Body mass index is 30.76 kg/(m^2). General: Well developed, well nourished, in no acute distress. Head: Normocephalic, atraumatic, eyes without discharge, sclera non-icteric, nares are without discharge.   Neck: Supple. No thyromegaly. Full ROM. No lymphadenopathy. Lungs: Clear bilaterally to auscultation without wheezes, rales, or rhonchi. Breathing is unlabored. Heart: RRR with S1 S2. No murmurs, rubs, or gallops appreciated. Msk:  Strength and tone normal for age. Extremities/Skin: Warm and dry. No clubbing or cyanosis. No edema. Left anterior leg with 0.75 cm discoid rash consistent with early erythema migrans. No secondary infection. Possible second bite site distal, versus excoriation mark as patient states lesion has been pruritic. See photo. Tick is black and brown and looks like a shell.  Neuro: Alert and oriented X 3. Moves all extremities spontaneously.  Gait is normal. CNII-XII grossly in tact. Psych:  Responds to questions appropriately with a normal affect.     ASSESSMENT AND PLAN:  62 y.o. female with tick bite and early erythema migrans.  -Doxycycline 100 mg  1 po bid #28 no RF -Zofran ODT 4 mg 1 po under tongue #20 no RF -Tick examined, removed in total -Bite site examined, no remnants in patient -I feel given the patient's prior circumstances, history, and physical exam  treatment is indicated this evening -RTC prn    Signed, Eula Listen, PA-C 01/19/2013 7:39 PM

## 2013-05-12 ENCOUNTER — Other Ambulatory Visit: Payer: Self-pay | Admitting: Orthopedic Surgery

## 2013-05-12 DIAGNOSIS — M549 Dorsalgia, unspecified: Secondary | ICD-10-CM

## 2013-05-16 ENCOUNTER — Ambulatory Visit
Admission: RE | Admit: 2013-05-16 | Discharge: 2013-05-16 | Disposition: A | Discharge status: Home / Self Care | Payer: BC Managed Care – PPO | Source: Ambulatory Visit | Attending: Orthopedic Surgery | Admitting: Orthopedic Surgery

## 2013-05-16 DIAGNOSIS — M549 Dorsalgia, unspecified: Secondary | ICD-10-CM

## 2013-05-19 ENCOUNTER — Other Ambulatory Visit: Payer: Self-pay | Admitting: Orthopedic Surgery

## 2013-05-19 DIAGNOSIS — M545 Low back pain: Secondary | ICD-10-CM

## 2013-05-23 ENCOUNTER — Ambulatory Visit
Admission: RE | Admit: 2013-05-23 | Discharge: 2013-05-23 | Disposition: A | Discharge status: Home / Self Care | Payer: BC Managed Care – PPO | Source: Ambulatory Visit | Attending: Orthopedic Surgery | Admitting: Orthopedic Surgery

## 2013-05-23 DIAGNOSIS — M545 Low back pain: Secondary | ICD-10-CM

## 2013-05-23 MED ORDER — GADOBENATE DIMEGLUMINE 529 MG/ML IV SOLN
15.0000 mL | Freq: Once | INTRAVENOUS | Status: AC | PRN
  Administered 2013-05-23: 15 mL via INTRAVENOUS

## 2013-06-09 DIAGNOSIS — M47817 Spondylosis without myelopathy or radiculopathy, lumbosacral region: Secondary | ICD-10-CM | POA: Insufficient documentation

## 2014-03-14 ENCOUNTER — Ambulatory Visit (INDEPENDENT_AMBULATORY_CARE_PROVIDER_SITE_OTHER): Payer: BC Managed Care – PPO | Admitting: Emergency Medicine

## 2014-03-14 VITALS — BP 123/80 | HR 75 | Temp 98.1°F | Resp 18 | Ht 63.0 in | Wt 170.6 lb

## 2014-03-14 DIAGNOSIS — R002 Palpitations: Secondary | ICD-10-CM

## 2014-03-14 DIAGNOSIS — F419 Anxiety disorder, unspecified: Secondary | ICD-10-CM

## 2014-03-14 DIAGNOSIS — F411 Generalized anxiety disorder: Secondary | ICD-10-CM

## 2014-03-14 MED ORDER — DIAZEPAM 2 MG PO TABS: 2.0000 mg | ORAL_TABLET | Freq: Four times a day (QID) | ORAL | Status: DC | PRN

## 2014-03-14 NOTE — Progress Notes (Signed)
   Subjective:    Patient ID: Deborah Archer, female    DOB: 02/04/1951, 63 y.o.   MRN: [id number]  HPI 63 yo female with complaint of palpitations when lying down at night.  Onset 2-3 days ago.  No chest pain.  Not a rapid heart rate.  States she can "hear a thumping heart".  Admits to significant amount of stress prior to onset.  States she has a history of anxiety as well.  Daughter has chronic lyme and this has been getting worse lately.  PP[facility]:  ASD repaired in 1986, anxiety, GERD  SH:  Nonsmoker, no alcohol  Review of Systems  Constitutional: Negative for fever, chills and unexpected weight change.  Respiratory: Negative for cough, chest tightness, shortness of breath, wheezing and stridor.   Cardiovascular: Positive for palpitations. Negative for chest pain and leg swelling.  Gastrointestinal: Negative for nausea, vomiting, abdominal pain, diarrhea and constipation.  Musculoskeletal: Negative for arthralgias, back pain and neck pain.  Neurological: Negative for seizures, syncope, weakness, light-headedness, numbness and headaches.       Objective:   Physical Exam Blood pressure 123/80, pulse 75, temperature 98.1 F (36.7 C), temperature source Oral, resp. rate 18, height 5\' 3"  (1.6 m), weight 170 lb 9.6 oz (77.384 kg), SpO2 98.00%. Body mass index is 30.23 kg/(m^2). Well-developed, well nourished female who is awake, alert and oriented, in NAD. HEENT: [place]/AT, PERRL, EOMI.  Sclera and conjunctiva are clear. OP is clear. Neck: supple, non-tender, no lymphadenopathy, thyromegaly. Heart: RRR, no murmur Lungs: normal effort, CTA Abdomen: normo-active bowel sounds, supple, non-tender, no mass or organomegaly. Extremities: no cyanosis, clubbing or edema. Skin: warm and dry without rash. Psychologic: good mood and appropriate affect, normal speech and behavior.  EKG:  NSR with normal intervals with RSR in V1.    Assessment & Plan:  Anxiety Palpitations  Patients given rx for 2mg   valium to use prn.

## 2014-06-09 ENCOUNTER — Encounter: Payer: Self-pay | Admitting: Cardiovascular Disease

## 2014-06-09 ENCOUNTER — Ambulatory Visit (INDEPENDENT_AMBULATORY_CARE_PROVIDER_SITE_OTHER): Payer: BC Managed Care – PPO | Admitting: Cardiovascular Disease

## 2014-06-09 VITALS — BP 138/80 | HR 84 | Ht 63.5 in | Wt 173.6 lb

## 2014-06-09 DIAGNOSIS — Z79899 Other long term (current) drug therapy: Secondary | ICD-10-CM

## 2014-06-09 DIAGNOSIS — R5383 Other fatigue: Secondary | ICD-10-CM

## 2014-06-09 DIAGNOSIS — Q2111 Secundum atrial septal defect: Secondary | ICD-10-CM

## 2014-06-09 DIAGNOSIS — Q211 Atrial septal defect, unspecified: Secondary | ICD-10-CM

## 2014-06-09 DIAGNOSIS — R5381 Other malaise: Secondary | ICD-10-CM

## 2014-06-09 DIAGNOSIS — E782 Mixed hyperlipidemia: Secondary | ICD-10-CM

## 2014-06-09 NOTE — Assessment & Plan Note (Signed)
Status post surgical repair by Dr. Doren Custard in 1987

## 2014-06-09 NOTE — Patient Instructions (Signed)
  We will see you back in follow up in 1 year with Dr Berry.   Dr Berry has ordered: Your physician recommends that you return for a FASTING lipid profile    

## 2014-06-09 NOTE — Progress Notes (Signed)
     06/09/2014 Deborah Archer   1951-01-27  [id number]  Primary Physician Marjorie Smolder, MD Primary Cardiologist: Lorretta Harp MD Renae Gloss   HPI:  Ms. Nowling is a 63 year old mildly overweight married Caucasian female mother of one daughter who is a retired Pharmacist, hospital and formally a patient of Dr. Producer, television/film/video. She self referred back to our practice to be established. Her cardiovascular status profiles remarkable for a father who had myocardial infarction and died from this. Otherwise, she has no history of diabetes, hypertension, hyperlipidemia or tobacco abuse. She has never had a heart attack or stroke. She denies chest pain or shortness of breath. She had an ASD repaired surgically by Dr. Doren Custard in 1987.   Current Outpatient Prescriptions  Medication Sig Dispense Refill  . lansoprazole (PREVACID) 15 MG capsule Take 15 mg by mouth daily. OTC       No current facility-administered medications for this visit.    Allergies  Allergen Reactions  . Amoxicillin   . Nsaids     History   Social History  . Marital Status: Married    Spouse Name: N/A    Number of Children: N/A  . Years of Education: N/A   Occupational History  . Not on file.   Social History Main Topics  . Smoking status: Never Smoker   . Smokeless tobacco: Not on file  . Alcohol Use: No  . Drug Use: No  . Sexual Activity: Yes    Birth Control/ Protection: None   Other Topics Concern  . Not on file   Social History Narrative  . No narrative on file     Review of Systems: General: negative for chills, fever, night sweats or weight changes.  Cardiovascular: negative for chest pain, dyspnea on exertion, edema, orthopnea, palpitations, paroxysmal nocturnal dyspnea or shortness of breath Dermatological: negative for rash Respiratory: negative for cough or wheezing Urologic: negative for hematuria Abdominal: negative for nausea, vomiting, diarrhea, bright red blood per rectum, melena, or  hematemesis Neurologic: negative for visual changes, syncope, or dizziness All other systems reviewed and are otherwise negative except as noted above.    Blood pressure 138/80, pulse 84, height 5' 3.5" (1.613 m), weight 173 lb 9.6 oz (78.744 kg).  General appearance: alert and no distress Neck: no adenopathy, no carotid bruit, no JVD, supple, symmetrical, trachea midline and thyroid not enlarged, symmetric, no tenderness/mass/nodules Lungs: clear to auscultation bilaterally Heart: regular rate and rhythm, S1, S2 normal, no murmur, click, rub or gallop Extremities: extremities normal, atraumatic, no cyanosis or edema and 2+ pedal pulses bilaterally  EKG normal sinus rhythm 84 without ST or T wave changes  ASSESSMENT AND PLAN:   Atrial septal defect Status post surgical repair by Dr. Doren Custard in [place] [facility] 06/09/2014 4:50 PM

## 2015-02-21 ENCOUNTER — Ambulatory Visit (INDEPENDENT_AMBULATORY_CARE_PROVIDER_SITE_OTHER): Payer: Medicare Other | Admitting: Physician Assistant

## 2015-02-21 ENCOUNTER — Ambulatory Visit (INDEPENDENT_AMBULATORY_CARE_PROVIDER_SITE_OTHER): Payer: Medicare Other

## 2015-02-21 VITALS — BP 140/70 | HR 79 | Temp 97.1°F | Resp 14 | Ht 63.0 in | Wt 169.2 lb

## 2015-02-21 DIAGNOSIS — M542 Cervicalgia: Secondary | ICD-10-CM

## 2015-02-21 MED ORDER — CYCLOBENZAPRINE HCL 10 MG PO TABS: 10.0000 mg | ORAL_TABLET | Freq: Three times a day (TID) | ORAL | Status: DC | PRN

## 2015-02-21 NOTE — Progress Notes (Signed)
Patient ID: Deborah Archer, female    DOB: 04/17/1951, 64 y.o.   MRN: [id number]  PCP: Marjorie Smolder, MD  Subjective:   Chief Complaint  Patient presents with  . Neck Pain    Right Side  . Shoulder Pain    Right Shoulder  . Back Pain    Low Back Pain    HPI Presents for evaluation of RIGHT sided neck pain.  Several weeks ago, travelled to [place] with her daughter for treatment of Lyme Disease, and since then has felt a little off, like the pillows on the bed aren't right.  About 4 days ago, developed increased pain on the RIGHT neck and shoulder. Initially just felt stiff, but now unable to rotate her neck without considerable pain. Applied moist heat, Aspercreme, ASA, acetaminophen without benefit. Low back pain began today.  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  Previous neck surgery, 2002.  Has had some bilateral knee pain and RIGHT hip pain after carrying heavy supplies for her daughter last week.  Increased stress: daughter may have a new diagnosis of POTS, jury duty (her request for deferment denied).   Review of Systems  Constitutional: Negative for fever and chills.  Cardiovascular: Negative for chest pain and palpitations.  Gastrointestinal: Negative for diarrhea and constipation.  Genitourinary: Positive for urgency (intermittently). Negative for dysuria, hematuria and flank pain.  Musculoskeletal: Positive for myalgias, back pain, arthralgias, neck pain and neck stiffness. Negative for joint swelling and gait problem.  Neurological: Positive for numbness (LEFT hand). Negative for dizziness, tremors, weakness and headaches.  Psychiatric/Behavioral: Negative for confusion, self-injury and dysphoric mood. The patient is not nervous/anxious.        Very stressed.       Patient Active Problem List   Diagnosis Date Noted  . Atrial septal defect 06/09/2014  . ANXIETY  03/27/2010  . GERD 03/27/2010  . CHANGE IN BOWELS 03/27/2010  . ABDOMINAL PAIN-RLQ 03/27/2010     Prior to Admission medications   Medication Sig Start Date End Date Taking? Authorizing Provider  Acetaminophen (TYLENOL 8 HOUR PO) Take by mouth.   Yes Historical Provider, MD  lansoprazole (PREVACID) 15 MG capsule Take 15 mg by mouth daily. OTC   Yes Historical Provider, MD     Allergies  Allergen Reactions  . Amoxicillin   . Nsaids        Objective:  Physical Exam  Constitutional: She is oriented to person, place, and time. She appears well-developed and well-nourished. No distress.  BP 140/70 mmHg  Pulse 79  Temp(Src) 97.1 F (36.2 C) (Oral)  Resp 14  Ht 5\' 3"  (1.6 m)  Wt 169 lb 3.2 oz (76.749 kg)  BMI 29.98 kg/m2  SpO2 98%   Eyes: Conjunctivae are normal. No scleral icterus.  Neck: No thyromegaly present.  Cardiovascular: Normal rate, regular rhythm, normal heart sounds and intact distal pulses.   Pulmonary/Chest: Effort normal and breath sounds normal.  Musculoskeletal:       Right shoulder: Normal.       Left shoulder: Normal.       Cervical back: She exhibits decreased range of motion, tenderness, pain and spasm. She exhibits no bony tenderness, no swelling and no edema.       Thoracic back: Normal.       Lumbar back: Normal.       Back:  Lymphadenopathy:    She  has no cervical adenopathy.  Neurological: She is alert and oriented to person, place, and time. She has normal strength. No cranial nerve deficit or sensory deficit.  Reflex Scores:      Bicep reflexes are 2+ on the right side and 2+ on the left side. Skin: Skin is warm and dry.  Psychiatric: She has a normal mood and affect. Her behavior is normal.    C-Spine: [facility] reading (PRIMARY) by  Dr. Marin Comment.  Hardware from previous surgery noted at C4-5. No other acute findings.         Assessment & Plan:   1. Neck pain on right side Musculoskeletal strain/wry neck. Supportive care. Anticipatory  guidance. OTC acetaminophen. Moist heat. Gentle ROM. - DG Cervical Spine Complete; Future - cyclobenzaprine (FLEXERIL) 10 MG tablet; Take 1 tablet (10 mg total) by mouth 3 (three) times daily as needed for muscle spasms.  Dispense: 30 tablet; Refill: 0   Meds ordered this encounter  Medications  . Acetaminophen (TYLENOL 8 HOUR PO)    Sig: Take by mouth.  . cyclobenzaprine (FLEXERIL) 10 MG tablet    Sig: Take 1 tablet (10 mg total) by mouth 3 (three) times daily as needed for muscle spasms.    Dispense:  30 tablet    Refill:  0    Order Specific Question:  Supervising Provider    Answer:  DOOLITTLE, ROBERT P [[id number]]     Fara Chute, PA-C Physician Assistant-Certified [facility] .

## 2015-02-23 ENCOUNTER — Encounter: Payer: Self-pay | Admitting: Gastroenterology

## 2015-06-15 DIAGNOSIS — G5600 Carpal tunnel syndrome, unspecified upper limb: Secondary | ICD-10-CM | POA: Insufficient documentation

## 2015-06-21 DIAGNOSIS — M542 Cervicalgia: Secondary | ICD-10-CM | POA: Insufficient documentation

## 2016-05-24 DIAGNOSIS — R300 Dysuria: Secondary | ICD-10-CM | POA: Insufficient documentation

## 2017-01-16 ENCOUNTER — Ambulatory Visit (INDEPENDENT_AMBULATORY_CARE_PROVIDER_SITE_OTHER): Payer: Medicare Other

## 2017-01-16 ENCOUNTER — Ambulatory Visit (INDEPENDENT_AMBULATORY_CARE_PROVIDER_SITE_OTHER): Payer: Medicare Other | Admitting: Physician Assistant

## 2017-01-16 VITALS — BP 129/78 | HR 76 | Temp 98.0°F | Resp 17 | Ht 63.0 in | Wt 178.0 lb

## 2017-01-16 DIAGNOSIS — M5136 Other intervertebral disc degeneration, lumbar region: Secondary | ICD-10-CM | POA: Insufficient documentation

## 2017-01-16 DIAGNOSIS — M51369 Other intervertebral disc degeneration, lumbar region without mention of lumbar back pain or lower extremity pain: Secondary | ICD-10-CM

## 2017-01-16 DIAGNOSIS — M545 Low back pain, unspecified: Secondary | ICD-10-CM

## 2017-01-16 DIAGNOSIS — M542 Cervicalgia: Secondary | ICD-10-CM | POA: Diagnosis not present

## 2017-01-16 DIAGNOSIS — Z981 Arthrodesis status: Secondary | ICD-10-CM

## 2017-01-16 DIAGNOSIS — M62838 Other muscle spasm: Secondary | ICD-10-CM | POA: Diagnosis not present

## 2017-01-16 DIAGNOSIS — M5030 Other cervical disc degeneration, unspecified cervical region: Secondary | ICD-10-CM | POA: Insufficient documentation

## 2017-01-16 MED ORDER — CYCLOBENZAPRINE HCL 5 MG PO TABS
5.0000 mg | ORAL_TABLET | Freq: Three times a day (TID) | ORAL | 0 refills | Status: DC | PRN
Start: 2017-01-16 — End: 2017-10-19

## 2017-01-16 NOTE — Patient Instructions (Addendum)
  Heat to back - gentle stretches - if no better let me know as I will refer you to back specialist if you would like further evaluation  Arthritis in C3-4 and C6-7 Arthritis in L3-4 and L5-S1   IF you received an x-ray today, you will receive an invoice from [facility]. Please contact [facility] at [phone] with questions or concerns regarding your invoice.   IF you received labwork today, you will receive an invoice from [place]. Please contact [facility] at [phone] with questions or concerns regarding your invoice.   Our billing staff will not be able to assist you with questions regarding bills from these companies.  You will be contacted with the lab results as soon as they are available. The fastest way to get your results is to activate your My Chart account. Instructions are located on the last page of this paperwork. If you have not heard from [place] regarding the results in 2 weeks, please contact this office.

## 2017-01-16 NOTE — Progress Notes (Signed)
Deborah Archer  MRN: [id number] DOB: 08/12/1951  PCP: Marjorie Smolder, MD  Chief Complaint  Patient presents with  . Back Pain    Subjective:  Pt presents to clinic for lower back pain that has been affecting her for years.  It flairs up with certain movements.  Typically it will go away with rest and time.  In 2002, she had 2 collapsed cervical vertebrae and she is worried she might have something similar.    On - 4/2 she sat up in her bed and she had pain immediately in her lower back.  In the past this pain would have been gone but the pain has not improved.  She is also having some neck pain over the last couple of days and this is what happened last time when she had collapsed vertibra.  She has been using tylenol for the pain.  She has known tight muscles and has used flexeril in the past that has worked well for her.   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    She has not pain radiation, paresthesias or B/B incontinence.  Review of Systems  Musculoskeletal: Positive for back pain (bilateral) and neck pain (seems to be more on the left side). Negative for gait problem.    Patient Active Problem List   Diagnosis Date Noted  . Degenerative disc disease, lumbar 01/16/2017  . Degenerative disc disease, cervical 01/16/2017  . Atrial septal defect 06/09/2014  . ANXIETY 03/27/2010  . GERD 03/27/2010  . CHANGE IN BOWELS 03/27/2010  . ABDOMINAL PAIN-RLQ 03/27/2010    Current Outpatient Prescriptions on File Prior to Visit  Medication Sig Dispense Refill  . Acetaminophen (TYLENOL 8 HOUR PO) Take by mouth.    . lansoprazole (PREVACID) 15 MG capsule Take 15 mg by mouth daily. OTC     No current facility-administered medications on file prior to visit.     Allergies  Allergen Reactions  . Amoxicillin   . Nsaids     Pt patients past, family and social history were reviewed and updated.   Objective:    BP 129/78   Pulse 76   Temp 98 F (36.7 C) (Oral)   Resp 17   Ht 5\' 3"  (1.6 m)   Wt 178 lb (80.7 kg)   SpO2 97%   BMI 31.53 kg/m   Physical Exam  Constitutional: She is oriented to person, place, and time and well-developed, well-nourished, and in no distress.  HENT:  Head: Normocephalic and atraumatic.  Right Ear: Hearing and external ear normal.  Left Ear: Hearing and external ear normal.  Eyes: Conjunctivae are normal.  Neck: Normal range of motion.  Pulmonary/Chest: Effort normal.  Musculoskeletal:       Cervical back: She exhibits tenderness and spasm. She exhibits normal range of motion and no bony tenderness.       Lumbar back: She exhibits decreased range of motion, tenderness and spasm. She exhibits no bony tenderness.       Back:  Neurological: She is alert and oriented to person, place, and time. She has normal sensation, normal strength and normal reflexes. She displays no weakness and normal reflexes. She has a normal Straight Leg Raise Test. Gait normal. Gait normal.  Reflex Scores:      Bicep reflexes are 2+ on the right side and 2+ on the left side.      Brachioradialis reflexes are 2+ on the right side and 2+  on the left side.      Patellar reflexes are 2+ on the right side and 2+ on the left side.      Achilles reflexes are 2+ on the right side and 2+ on the left side. Skin: Skin is warm and dry.  Psychiatric: Mood, memory, affect and judgment normal.  Vitals reviewed.   Dg Cervical Spine 2 Or 3 Views  Result Date: 01/16/2017 CLINICAL DATA:  Neck pain.  Previous cervical fusion. EXAM: CERVICAL SPINE - 2-3 VIEW COMPARISON:  02/21/2015 FINDINGS: Alignment is normal. Previous ACDF C4 through C6. Anterior plate at the Y2-4 level. Fusion in this segment appears solid. Disc space narrowing at C3-4 and C6-7 with small endplate osteophytes. Those degenerative changes could be symptomatic. IMPRESSION: Previous fusion C4 through C6. Degenerative spondylosis above and  below at the C3-4 and C6-7 levels. Electronically Signed   By: Nelson Chimes M.D.   On: 01/16/2017 16:22   Dg Lumbar Spine 2-3 Views  Result Date: 01/16/2017 CLINICAL DATA:  Back pain with movement. EXAM: LUMBAR SPINE - 2-3 VIEW COMPARISON:  None. FINDINGS: Mild thoracolumbar curvature convex to the left. Disc space narrowing throughout the lumbar region, most pronounced at L3-4 and L5-S1. No evidence of fracture or other focal lesion. Lower lumbar facet osteoarthritis. Sacroiliac joints appear unremarkable. IMPRESSION: Mild curvature convex to the left. Lumbar region degenerative disc disease, most pronounced at L3-4 and L5-S1. Lower lumbar facet osteoarthritis. Findings could be symptomatic. Electronically Signed   By: Nelson Chimes M.D.   On: 01/16/2017 16:23    Assessment and Plan :  Neck pain - Plan: DG Cervical Spine 2 or 3 views  Acute bilateral low back pain without sciatica - Plan: DG Lumbar Spine 2-3 Views  Muscle spasm - Plan: cyclobenzaprine (FLEXERIL) 5 MG tablet  Degenerative disc disease, cervical  Degenerative disc disease, lumbar  Hx of fusion of cervical spine  Pt to continue tylenol for the pain.  Rest and heat to the muscles - pt might be helpful to the patient - gave her flexeril to use and the warnings that go with the medication - she will use the smallest amount for the shortest time period - now and in the future.  If she develops worsening pain she will let me know for a referral to a specialist.  Windell Hummingbird PA-C  [facility] [place] 01/16/2017 7:35 PM

## 2017-08-14 ENCOUNTER — Other Ambulatory Visit: Payer: Self-pay | Admitting: Ophthalmology

## 2017-08-14 DIAGNOSIS — H4922 Sixth [abducent] nerve palsy, left eye: Secondary | ICD-10-CM

## 2017-08-25 ENCOUNTER — Ambulatory Visit
Admission: RE | Admit: 2017-08-25 | Discharge: 2017-08-25 | Disposition: A | Discharge status: Home / Self Care | Payer: Medicare Other | Source: Ambulatory Visit | Attending: Ophthalmology | Admitting: Ophthalmology

## 2017-08-25 DIAGNOSIS — H4922 Sixth [abducent] nerve palsy, left eye: Secondary | ICD-10-CM

## 2017-08-25 MED ORDER — GADOBENATE DIMEGLUMINE 529 MG/ML IV SOLN
15.0000 mL | Freq: Once | INTRAVENOUS | Status: AC | PRN
  Administered 2017-08-25: 15 mL via INTRAVENOUS

## 2017-09-02 DIAGNOSIS — D320 Benign neoplasm of cerebral meninges: Secondary | ICD-10-CM | POA: Insufficient documentation

## 2017-09-02 DIAGNOSIS — D329 Benign neoplasm of meninges, unspecified: Secondary | ICD-10-CM | POA: Insufficient documentation

## 2017-10-19 ENCOUNTER — Encounter (HOSPITAL_BASED_OUTPATIENT_CLINIC_OR_DEPARTMENT_OTHER): Payer: Self-pay | Admitting: *Deleted

## 2017-10-19 ENCOUNTER — Other Ambulatory Visit: Payer: Self-pay

## 2017-10-20 ENCOUNTER — Ambulatory Visit: Payer: Self-pay | Admitting: Ophthalmology

## 2017-10-21 ENCOUNTER — Ambulatory Visit: Payer: Self-pay | Admitting: Ophthalmology

## 2017-10-21 NOTE — H&P (View-Only) (Signed)
Date of examination:  10-19-17  Indication for surgery: to straighten the eyes and relieve diplopia  Pertinent past medical history:  Past Medical History:  Diagnosis Date  . Esotropia   . GERD (gastroesophageal reflux disease)   . Heart murmur   . Neuromuscular disorder ([place])   . 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 Doren Custard in 1987    Pertinent ocular history:  Hx surgery x 2 for Left 6th nerve palsy, which has now recurred.  MRI showed skull base meningioma which had not been seen on previous MRI  Pertinent family history:  Family History  Problem Relation Age of Onset  . Cancer Mother        brain tumor  . Heart disease Father        heart attack  . Stroke Maternal Grandmother   . Stroke Maternal Grandfather   . Cancer Paternal Grandmother        Breast  . Heart attack Paternal Grandfather     General:  Healthy appearing patient in no distress.    Eyes:    Acuity cc OD 20/25  OS 20/20  External: Within normal limits  x L  face turn  Anterior segment: Within normal limits  X healed conj scars  Motility:   LET=17 in primary, 0 in right gaze, 45 in L gaze, 2-3- LLR  Fundus: No disc edema/pallor   Refraction: -6 OU approx  Heart: Regular rate and rhythm without murmur     Lungs: Clear to auscultation     Impression:  Left 6th nerve palsy, recurrent   Left petrous apex meningioma  Plan: Left medial rectus muscle recession vs. Transposition of left superior rectus to left lateral rectus with posterior fixation  Derry Skill

## 2017-10-21 NOTE — H&P (Signed)
Date of examination:  10-19-17  Indication for surgery: to straighten the eyes and relieve diplopia  Pertinent past medical history:  Past Medical History:  Diagnosis Date  . Esotropia   . GERD (gastroesophageal reflux disease)   . Heart murmur   . Neuromuscular disorder ([place])   . 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 Doren Custard in 1987    Pertinent ocular history:  Hx surgery x 2 for Left 6th nerve palsy, which has now recurred.  MRI showed skull base meningioma which had not been seen on previous MRI  Pertinent family history:  Family History  Problem Relation Age of Onset  . Cancer Mother        brain tumor  . Heart disease Father        heart attack  . Stroke Maternal Grandmother   . Stroke Maternal Grandfather   . Cancer Paternal Grandmother        Breast  . Heart attack Paternal Grandfather     General:  Healthy appearing patient in no distress.    Eyes:    Acuity cc OD 20/25  OS 20/20  External: Within normal limits  x L  face turn  Anterior segment: Within normal limits  X healed conj scars  Motility:   LET=17 in primary, 0 in right gaze, 45 in L gaze, 2-3- LLR  Fundus: No disc edema/pallor   Refraction: -6 OU approx  Heart: Regular rate and rhythm without murmur     Lungs: Clear to auscultation     Impression:  Left 6th nerve palsy, recurrent   Left petrous apex meningioma  Plan: Left medial rectus muscle recession vs. Transposition of left superior rectus to left lateral rectus with posterior fixation  Derry Skill

## 2017-10-23 ENCOUNTER — Ambulatory Visit (HOSPITAL_BASED_OUTPATIENT_CLINIC_OR_DEPARTMENT_OTHER): Payer: Medicare Other | Admitting: Anesthesiology

## 2017-10-23 ENCOUNTER — Encounter (HOSPITAL_BASED_OUTPATIENT_CLINIC_OR_DEPARTMENT_OTHER)
Admission: RE | Disposition: A | Discharge status: Home / Self Care | Payer: Self-pay | Source: Ambulatory Visit | Attending: Ophthalmology

## 2017-10-23 ENCOUNTER — Other Ambulatory Visit: Payer: Self-pay

## 2017-10-23 ENCOUNTER — Ambulatory Visit (HOSPITAL_BASED_OUTPATIENT_CLINIC_OR_DEPARTMENT_OTHER)
Admission: RE | Admit: 2017-10-23 | Discharge: 2017-10-23 | Disposition: A | Discharge status: Home / Self Care | Payer: Medicare Other | Source: Ambulatory Visit | Attending: Ophthalmology | Admitting: Ophthalmology

## 2017-10-23 ENCOUNTER — Encounter (HOSPITAL_BASED_OUTPATIENT_CLINIC_OR_DEPARTMENT_OTHER): Payer: Self-pay | Admitting: Anesthesiology

## 2017-10-23 DIAGNOSIS — K219 Gastro-esophageal reflux disease without esophagitis: Secondary | ICD-10-CM | POA: Insufficient documentation

## 2017-10-23 DIAGNOSIS — Z79899 Other long term (current) drug therapy: Secondary | ICD-10-CM | POA: Insufficient documentation

## 2017-10-23 DIAGNOSIS — D320 Benign neoplasm of cerebral meninges: Secondary | ICD-10-CM | POA: Insufficient documentation

## 2017-10-23 DIAGNOSIS — H4922 Sixth [abducent] nerve palsy, left eye: Secondary | ICD-10-CM | POA: Insufficient documentation

## 2017-10-23 HISTORY — PX: STRABISMUS SURGERY: SHX218

## 2017-10-23 HISTORY — DX: Unspecified esotropia: H50.00

## 2017-10-23 SURGERY — REPAIR STRABISMUS
Anesthesia: General | Site: Eye | Laterality: Left

## 2017-10-23 MED ORDER — GLYCOPYRROLATE 0.2 MG/ML IJ SOLN
INTRAMUSCULAR | Status: DC | PRN
  Administered 2017-10-23: .2 mg via INTRAVENOUS

## 2017-10-23 MED ORDER — FENTANYL CITRATE (PF) 100 MCG/2ML IJ SOLN
25.0000 ug | INTRAMUSCULAR | Status: DC | PRN
  Administered 2017-10-23 (×2): 50 ug via INTRAVENOUS

## 2017-10-23 MED ORDER — PROPOFOL 10 MG/ML IV BOLUS
INTRAVENOUS | Status: DC | PRN
  Administered 2017-10-23: 160 mg via INTRAVENOUS

## 2017-10-23 MED ORDER — FENTANYL CITRATE (PF) 100 MCG/2ML IJ SOLN
INTRAMUSCULAR | Status: AC
  Filled 2017-10-23: qty 2

## 2017-10-23 MED ORDER — DEXAMETHASONE SODIUM PHOSPHATE 4 MG/ML IJ SOLN
INTRAMUSCULAR | Status: DC | PRN
  Administered 2017-10-23: 10 mg via INTRAVENOUS

## 2017-10-23 MED ORDER — TOBRAMYCIN-DEXAMETHASONE 0.3-0.1 % OP OINT
TOPICAL_OINTMENT | OPHTHALMIC | Status: DC | PRN
  Administered 2017-10-23: 1 via OPHTHALMIC

## 2017-10-23 MED ORDER — MIDAZOLAM HCL 2 MG/2ML IJ SOLN: 1.0000 mg | INTRAMUSCULAR | Status: DC | PRN

## 2017-10-23 MED ORDER — LACTATED RINGERS IV SOLN
INTRAVENOUS | Status: DC | PRN
  Administered 2017-10-23: 10:00:00 via INTRAVENOUS

## 2017-10-23 MED ORDER — ONDANSETRON HCL 4 MG/2ML IJ SOLN: 4.0000 mg | Freq: Once | INTRAMUSCULAR | Status: DC | PRN

## 2017-10-23 MED ORDER — SCOPOLAMINE 1 MG/3DAYS TD PT72: 1.0000 | MEDICATED_PATCH | Freq: Once | TRANSDERMAL | Status: DC | PRN

## 2017-10-23 MED ORDER — ONDANSETRON HCL 4 MG/2ML IJ SOLN
INTRAMUSCULAR | Status: DC | PRN
  Administered 2017-10-23: 4 mg via INTRAVENOUS

## 2017-10-23 MED ORDER — LACTATED RINGERS IV SOLN
INTRAVENOUS | Status: DC
  Administered 2017-10-23: 10:00:00 via INTRAVENOUS

## 2017-10-23 MED ORDER — FENTANYL CITRATE (PF) 100 MCG/2ML IJ SOLN: 50.0000 ug | INTRAMUSCULAR | Status: DC | PRN

## 2017-10-23 MED ORDER — MIDAZOLAM HCL 5 MG/5ML IJ SOLN
INTRAMUSCULAR | Status: DC | PRN
  Administered 2017-10-23: 2 mg via INTRAVENOUS

## 2017-10-23 MED ORDER — MIDAZOLAM HCL 2 MG/2ML IJ SOLN
INTRAMUSCULAR | Status: AC
  Filled 2017-10-23: qty 2

## 2017-10-23 MED ORDER — FENTANYL CITRATE (PF) 100 MCG/2ML IJ SOLN
INTRAMUSCULAR | Status: DC | PRN
  Administered 2017-10-23: 100 ug via INTRAVENOUS

## 2017-10-23 MED ORDER — LIDOCAINE 2% (20 MG/ML) 5 ML SYRINGE
INTRAMUSCULAR | Status: DC | PRN
  Administered 2017-10-23: 40 mg via INTRAVENOUS

## 2017-10-23 SURGICAL SUPPLY — 34 items
APL SRG 3 HI ABS STRL LF PLS (MISCELLANEOUS) ×1
APPLICATOR COTTON TIP 6IN STRL (MISCELLANEOUS) ×12 IMPLANT
APPLICATOR DR MATTHEWS STRL (MISCELLANEOUS) ×3 IMPLANT
BANDAGE EYE OVAL (MISCELLANEOUS) IMPLANT
CAUTERY EYE LOW TEMP 1300F FIN (OPHTHALMIC RELATED) IMPLANT
CLOSURE WOUND 1/4X4 (GAUZE/BANDAGES/DRESSINGS)
COVER BACK TABLE 60X90IN (DRAPES) ×3 IMPLANT
COVER MAYO STAND STRL (DRAPES) ×3 IMPLANT
DRAPE SURG 17X23 STRL (DRAPES) ×6 IMPLANT
DRAPE U-SHAPE 76X120 STRL (DRAPES) ×2 IMPLANT
GLOVE BIO SURGEON STRL SZ 6.5 (GLOVE) ×2 IMPLANT
GLOVE BIO SURGEONS STRL SZ 6.5 (GLOVE)
GLOVE BIOGEL M STRL SZ7.5 (GLOVE) ×1 IMPLANT
GLOVE SURG SS PI 6.5 STRL IVOR (GLOVE) ×4 IMPLANT
GLOVE SURG SS PI 7.5 STRL IVOR (GLOVE) ×2 IMPLANT
GOWN STRL REUS W/ TWL LRG LVL3 (GOWN DISPOSABLE) ×1 IMPLANT
GOWN STRL REUS W/TWL LRG LVL3 (GOWN DISPOSABLE) ×3
GOWN STRL REUS W/TWL XL LVL3 (GOWN DISPOSABLE) ×4 IMPLANT
NS IRRIG 1000ML POUR BTL (IV SOLUTION) ×3 IMPLANT
PACK BASIN DAY SURGERY FS (CUSTOM PROCEDURE TRAY) ×3 IMPLANT
SHEET MEDIUM DRAPE 40X70 STRL (DRAPES) IMPLANT
SPEAR EYE SURG WECK-CEL (MISCELLANEOUS) ×6 IMPLANT
STRIP CLOSURE SKIN 1/4X4 (GAUZE/BANDAGES/DRESSINGS) IMPLANT
SUT 6 0 SILK T G140 8DA (SUTURE) ×2 IMPLANT
SUT MERSILENE 6-0 18IN S14 8MM (SUTURE)
SUT PLAIN 6 0 TG1408 (SUTURE) ×2 IMPLANT
SUT SILK 4 0 C 3 735G (SUTURE) IMPLANT
SUT VICRYL 6 0 S 28 (SUTURE) IMPLANT
SUT VICRYL ABS 6-0 S29 18IN (SUTURE) ×2 IMPLANT
SUTURE MERSLN 6-0 18IN S14 8MM (SUTURE) IMPLANT
SYR 10ML LL (SYRINGE) ×3 IMPLANT
SYR TB 1ML LL NO SAFETY (SYRINGE) ×3 IMPLANT
TOWEL OR 17X24 6PK STRL BLUE (TOWEL DISPOSABLE) ×3 IMPLANT
TRAY DSU PREP LF (CUSTOM PROCEDURE TRAY) ×3 IMPLANT

## 2017-10-23 NOTE — Op Note (Signed)
10/23/2017  11:10 AM  PATIENT:  Molli Barrows  67 y.o. female  PRE-OPERATIVE DIAGNOSIS: 1.   Left 6th nerve palsy, recurrent     2.  Left petrous apex meningioma  POST-OPERATIVE DIAGNOSIS:  same  PROCEDURE:  Medial rectus muscle recession  6.5 mm left eye  SURGEON:  Lorne Skeens.Annamaria Boots, M.D.   ANESTHESIA:   general  COMPLICATIONS:None  DESCRIPTION OF PROCEDURE: The patient was taken to the operating room where She was identified by me. General anesthesia was induced without difficulty after placement of appropriate monitors. The patient was prepped and draped in standard sterile fashion. A lid speculum was placed in the left eye.  Through a Swan incision through conjunctiva and Tenon's fascia, the left medial rectus muscle was engaged on a series of muscle hooks and cleared of its fascial attachments. The tendon was secured with a double-armed 6-0 Vicryl suture with a double locking bite at each border of the muscle, 1 mm from the insertion. The muscle was disinserted, and was reattached to sclera at a measured distance of 6.5 millimeters posterior to the original insertion, using direct scleral passes in crossed swords fashion.  The suture ends were tied securely after the position of the muscle had been checked and found to be accurate. Conjunctiva was closed with 2 6-0 plain gut sutures.  TobraDex ointment was placed in the left eye. The patient was awakened without difficulty and taken to the recovery room in stable condition, having suffered no intraoperative or immediate postoperative complications.  Lorne Skeens. Norena Bratton M.D.    PATIENT DISPOSITION:  PACU - hemodynamically stable.

## 2017-10-23 NOTE — Interval H&P Note (Signed)
History and Physical Interval Note:  10/23/2017 10:07 AM  Deborah Archer  has presented today for surgery, with the diagnosis of ESOTROPIA  The various methods of treatment have been discussed with the patient and family. After consideration of risks, benefits and other options for treatment, the patient has consented to  Procedure(s): REPAIR STRABISMUS LEFT EYE (Left) as a surgical intervention .  The patient's history has been reviewed, patient examined, no change in status, stable for surgery.  I have reviewed the patient's chart and labs.  Questions were answered to the patient's satisfaction.     Derry Skill

## 2017-10-23 NOTE — Discharge Instructions (Signed)
Diet: Clear liquids, advance to soft foods then regular diet as tolerated by this evening.  Pain control:   1)  Acetaminophen 350-700 mg by mouth every 4-6 hours as needed for pain  2)  Ice pack/cold compress to operated eye(s) as desired  Eye medications:  Tobradex  eye ointment 1/2 inch in operated eye(s) twice a day i  Activity: No swimming for 1 week.  It is OK to let water run over the face and eyes while showering or taking a bath, even during the first week.  No other restriction on exercise or activity.   Call Dr. Janee Morn office [phone] with any problems or concerns.        Post Anesthesia Home Care Instructions  Activity: Get plenty of rest for the remainder of the day. A responsible individual must stay with you for 24 hours following the procedure.  For the next 24 hours, DO NOT: -Drive a car -Paediatric nurse -Drink alcoholic beverages -Take any medication unless instructed by your physician -Make any legal decisions or sign important papers.  Meals: Start with liquid foods such as gelatin or soup. Progress to regular foods as tolerated. Avoid greasy, spicy, heavy foods. If nausea and/or vomiting occur, drink only clear liquids until the nausea and/or vomiting subsides. Call your physician if vomiting continues.  Special Instructions/Symptoms: Your throat may feel dry or sore from the anesthesia or the breathing tube placed in your throat during surgery. If this causes discomfort, gargle with warm salt water. The discomfort should disappear within 24 hours.  If you had a scopolamine patch placed behind your ear for the management of post- operative nausea and/or vomiting:  1. The medication in the patch is effective for 72 hours, after which it should be removed.  Wrap patch in a tissue and discard in the trash. Wash hands thoroughly with soap and water. 2. You may remove the patch earlier than 72 hours if you experience unpleasant side effects which may  include dry mouth, dizziness or visual disturbances. 3. Avoid touching the patch. Wash your hands with soap and water after contact with the patch.

## 2017-10-23 NOTE — Transfer of Care (Signed)
Immediate Anesthesia Transfer of Care Note  Patient: Deborah Archer  Procedure(s) Performed: STRABISMUS REPAIR LEFT EYE (Left Eye)  Patient Location: PACU  Anesthesia Type:General  Level of Consciousness: awake and sedated  Airway & Oxygen Therapy: Patient Spontanous Breathing and Patient connected to face mask oxygen  Post-op Assessment: Report given to RN and Post -op Vital signs reviewed and stable  Post vital signs: Reviewed and stable  Last Vitals:  Vitals:   10/23/17 1006  BP: (!) 151/80  Pulse: 70  Resp: 16  Temp: 36.6 C  SpO2: 100%    Last Pain:  Vitals:   10/23/17 1006  TempSrc: Oral  PainSc:          Complications: No apparent anesthesia complications

## 2017-10-23 NOTE — Anesthesia Postprocedure Evaluation (Signed)
Anesthesia Post Note  Patient: [name]  Procedure(s) Performed: STRABISMUS REPAIR LEFT EYE (Left Eye)     Patient location during evaluation: PACU Anesthesia Type: General Level of consciousness: awake and alert Pain management: pain level controlled Vital Signs Assessment: post-procedure vital signs reviewed and stable Respiratory status: spontaneous breathing, nonlabored ventilation, respiratory function stable and patient connected to nasal cannula oxygen Cardiovascular status: blood pressure returned to baseline and stable Postop Assessment: no apparent nausea or vomiting Anesthetic complications: no    Last Vitals:  Vitals:   10/23/17 1215 10/23/17 1230  BP: (!) 151/83 (!) 112/92  Pulse: 67 65  Resp: 12 14  Temp:  36.7 C  SpO2: 95% 94%    Last Pain:  Vitals:   10/23/17 1200  TempSrc:   PainSc: 4                  Dink Creps P Fadel Clason

## 2017-10-23 NOTE — Anesthesia Procedure Notes (Signed)
Procedure Name: LMA Insertion Performed by: Chay Mazzoni W, CRNA Pre-anesthesia Checklist: Patient identified, Emergency Drugs available, Suction available and Patient being monitored Patient Re-evaluated:Patient Re-evaluated prior to induction Oxygen Delivery Method: Circle system utilized Preoxygenation: Pre-oxygenation with 100% oxygen Induction Type: IV induction Ventilation: Mask ventilation without difficulty LMA: LMA flexible inserted LMA Size: 4.0 Number of attempts: 1 Placement Confirmation: positive ETCO2 Tube secured with: Tape Dental Injury: Teeth and Oropharynx as per pre-operative assessment        

## 2017-10-23 NOTE — Anesthesia Preprocedure Evaluation (Addendum)
Anesthesia Evaluation  Patient identified by MRN, date of birth, ID band Patient awake    Reviewed: Allergy & Precautions, NPO status , Patient's Chart, lab work & pertinent test results  Airway Mallampati: II  TM Distance: >3 FB Neck ROM: Full    Dental no notable dental hx.    Pulmonary neg pulmonary ROS,    Pulmonary exam normal breath sounds clear to auscultation       Cardiovascular Normal cardiovascular exam+ Valvular Problems/Murmurs  Rhythm:Regular Rate:Normal  Residual ASD (atrial septal defect) following repair   Neuro/Psych negative neurological ROS  negative psych ROS   GI/Hepatic Neg liver ROS, GERD  Medicated and Controlled,  Endo/Other  negative endocrine ROS  Renal/GU negative Renal ROS     Musculoskeletal negative musculoskeletal ROS (+)   Abdominal   Peds  Hematology negative hematology ROS (+)   Anesthesia Other Findings ESOTROPIA  Reproductive/Obstetrics                            Anesthesia Physical Anesthesia Plan  ASA: II  Anesthesia Plan: General   Post-op Pain Management:    Induction: Intravenous  PONV Risk Score and Plan: 3 and Dexamethasone, Ondansetron, Midazolam and Treatment may vary due to age or medical condition  Airway Management Planned: LMA  Additional Equipment:   Intra-op Plan:   Post-operative Plan: Extubation in OR  Informed Consent: I have reviewed the patients History and Physical, chart, labs and discussed the procedure including the risks, benefits and alternatives for the proposed anesthesia with the patient or authorized representative who has indicated his/her understanding and acceptance.   Dental advisory given  Plan Discussed with: CRNA  Anesthesia Plan Comments:         Anesthesia Quick Evaluation

## 2017-10-26 ENCOUNTER — Encounter (HOSPITAL_BASED_OUTPATIENT_CLINIC_OR_DEPARTMENT_OTHER): Payer: Self-pay | Admitting: Ophthalmology

## 2018-01-25 ENCOUNTER — Other Ambulatory Visit: Payer: Self-pay | Admitting: Neurosurgery

## 2018-01-25 DIAGNOSIS — D329 Benign neoplasm of meninges, unspecified: Secondary | ICD-10-CM

## 2018-02-25 ENCOUNTER — Ambulatory Visit
Admission: RE | Admit: 2018-02-25 | Discharge: 2018-02-25 | Disposition: A | Discharge status: Home / Self Care | Payer: Medicare Other | Source: Ambulatory Visit | Attending: Neurosurgery | Admitting: Neurosurgery

## 2018-02-25 DIAGNOSIS — D329 Benign neoplasm of meninges, unspecified: Secondary | ICD-10-CM

## 2018-02-25 MED ORDER — GADOBENATE DIMEGLUMINE 529 MG/ML IV SOLN
15.0000 mL | Freq: Once | INTRAVENOUS | Status: AC | PRN
  Administered 2018-02-25: 15 mL via INTRAVENOUS

## 2018-04-16 ENCOUNTER — Encounter: Payer: Self-pay | Admitting: Family Medicine

## 2018-04-16 ENCOUNTER — Other Ambulatory Visit: Payer: Self-pay

## 2018-04-16 ENCOUNTER — Ambulatory Visit: Payer: Medicare Other | Admitting: Family Medicine

## 2018-04-16 VITALS — BP 124/82 | HR 78 | Temp 98.0°F | Resp 16 | Ht 63.78 in | Wt 172.0 lb

## 2018-04-16 DIAGNOSIS — Z636 Dependent relative needing care at home: Secondary | ICD-10-CM | POA: Diagnosis not present

## 2018-04-16 DIAGNOSIS — S0340XA Sprain of jaw, unspecified side, initial encounter: Secondary | ICD-10-CM

## 2018-04-16 DIAGNOSIS — K219 Gastro-esophageal reflux disease without esophagitis: Secondary | ICD-10-CM | POA: Diagnosis not present

## 2018-04-16 DIAGNOSIS — J029 Acute pharyngitis, unspecified: Secondary | ICD-10-CM | POA: Diagnosis not present

## 2018-04-16 LAB — POCT RAPID STREP A (OFFICE): RAPID STREP A SCREEN: NEGATIVE

## 2018-04-16 MED ORDER — PENICILLIN V POTASSIUM 250 MG PO TABS: 250.0000 mg | ORAL_TABLET | Freq: Three times a day (TID) | ORAL | 0 refills | Status: DC

## 2018-04-16 NOTE — Patient Instructions (Addendum)
     IF you received an x-ray today, you will receive an invoice from [facility]. Please contact [facility] at [phone] with questions or concerns regarding your invoice.   IF you received labwork today, you will receive an invoice from [facility]. Please contact [facility] at [phone] with questions or concerns regarding your invoice.   Our billing staff will not be able to assist you with questions regarding bills from these companies.  You will be contacted with the lab results as soon as they are available. The fastest way to get your results is to activate your My Chart account. Instructions are located on the last page of this paperwork. If you have not heard from us regarding the results in 2 weeks, please contact this office.     Sore Throat A sore throat is pain, burning, irritation, or scratchiness in the throat. When you have a sore throat, you may feel pain or tenderness in your throat when you swallow or talk. Many things can cause a sore throat, including:  An infection.  Seasonal allergies.  Dryness in the air.  Irritants, such as smoke or pollution.  Gastroesophageal reflux disease (GERD).  A tumor.  A sore throat is often the first sign of another sickness. It may happen with other symptoms, such as coughing, sneezing, fever, and swollen neck glands. Most sore throats go away without medical treatment. Follow these instructions at home:  Take over-the-counter medicines only as told by your health care provider.  Drink enough fluids to keep your urine clear or pale yellow.  Rest as needed.  To help with pain, try: ? Sipping warm liquids, such as broth, herbal tea, or warm water. ? Eating or drinking cold or frozen liquids, such as frozen ice pops. ? Gargling with a salt-water mixture 3-4 times a day or as needed. To make a salt-water mixture, completely dissolve -1 tsp of salt in 1 cup of warm water. ? Sucking on hard candy or throat  lozenges. ? Putting a cool-mist humidifier in your bedroom at night to moisten the air. ? Sitting in the bathroom with the door closed for 5-10 minutes while you run hot water in the shower.  Do not use any tobacco products, such as cigarettes, chewing tobacco, and e-cigarettes. If you need help quitting, ask your health care provider. Contact a health care provider if:  You have a fever for more than 2-3 days.  You have symptoms that last (are persistent) for more than 2-3 days.  Your throat does not get better within 7 days.  You have a fever and your symptoms suddenly get worse. Get help right away if:  You have difficulty breathing.  You cannot swallow fluids, soft foods, or your saliva.  You have increased swelling in your throat or neck.  You have persistent nausea and vomiting. This information is not intended to replace advice given to you by your health care provider. Make sure you discuss any questions you have with your health care provider. Document Released: 10/30/2004 Document Revised: 05/18/2016 Document Reviewed: 07/13/2015 Elsevier Interactive Patient Education  2018 Elsevier Inc.  

## 2018-04-16 NOTE — Progress Notes (Signed)
Subjective:    Patient ID: Deborah Archer, female    DOB: 1951-04-28, 67 y.o.   MRN: [id number]  04/16/2018     HPI This 67 y.o. female presents for evaluation of sore throat, R eare pain.  Has GERD and clenching.  R sided sore throat and some L sided sore throat.  Also has mild headache. Sore in R anterior glands.  No fever; +chills; no sweats Mild rhinorrhea; +sneezing.  No cough; no n/v/d.   Has suffered with muscle cramps in neck.   Took throat lozenge; had upset stomach the next morning.   Daughter is chronically ill and recently underwent dental surgical treatment.  Patient is very worried to be sick while caring for her daughter.   BP Readings from Last 3 Encounters:  04/16/18 124/82  10/23/17 (!) 112/92  01/16/17 129/78   Wt Readings from Last 3 Encounters:  04/16/18 172 lb (78 kg)  10/23/17 171 lb 6.4 oz (77.7 kg)  01/16/17 178 lb (80.7 kg)    There is no immunization history on file for this patient.  Review of Systems  Constitutional: Positive for chills. Negative for diaphoresis, fatigue and fever.  HENT: Positive for congestion, ear pain, sore throat and trouble swallowing. Negative for postnasal drip, rhinorrhea, sinus pressure and voice change.   Respiratory: Negative for cough and shortness of breath.   Cardiovascular: Negative for chest pain, palpitations and leg swelling.  Gastrointestinal: Negative for abdominal pain, constipation, diarrhea, nausea and vomiting.  Neurological: Negative for dizziness and headaches.    Past Medical History:  Diagnosis Date  . Esotropia   . GERD (gastroesophageal reflux disease)   . Heart murmur   . Neuromuscular disorder ([place])   . 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 Doren Custard in 1987   Past Surgical History:  Procedure Laterality Date  . ASD REPAIR  1986  . CARPAL TUNNEL RELEASE Right 2009   Dr. Trenton Gammon  . CERVICAL SPINE SURGERY  2002   diskectomy, Dr. Trenton Gammon  . EYE SURGERY  Bilateral 2010, 2015   VI nerve paresis  . STRABISMUS SURGERY Left 10/23/2017   Procedure: STRABISMUS REPAIR LEFT EYE;  Surgeon: Everitt Amber, MD;  Location: [facility];  Service: Ophthalmology;  Laterality: Left;   Allergies  Allergen Reactions  . Amoxicillin Diarrhea  . Nsaids Other (See Comments)    Stomach aches  . Latex Other (See Comments)    Claims redness around mouth at dentist   Current Outpatient Medications on File Prior to Visit  Medication Sig Dispense Refill  . Acetaminophen (TYLENOL 8 HOUR PO) Take by mouth.    . lansoprazole (PREVACID) 15 MG capsule Take 15 mg by mouth daily. OTC     No current facility-administered medications on file prior to visit.    Social History   Socioeconomic History  . Marital status: Married    Spouse name: Maylon Cos  . Number of children: 1  . Years of education: College  . Highest education level: Not on file  Occupational History  . Occupation: retired Pharmacist, hospital    Comment: 2004  . 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 Financial resource strain: Not on file  . Food insecurity:    Worry: Not on file    Inability: Not on file  . Transportation needs:    Medical: Not on file    Non-medical: Not on file  Tobacco Use  .  Smoking status: Never Smoker  . Smokeless tobacco: Never Used  Substance and Sexual Activity  . Alcohol use: No    Alcohol/week: 0.0 oz  . Drug use: No  . Sexual activity: Yes    Partners: Male    Birth control/protection: Post-menopausal  Lifestyle  . Physical activity:    Days per week: Not on file    Minutes per session: Not on file  . Stress: Not on file  Relationships  . Social connections:    Talks on phone: Not on file    Gets together: Not on file    Attends religious service: Not on file    Active member of club or organization: Not on file    Attends meetings of clubs or organizations: Not on file    Relationship  status: Not on file  . Intimate partner violence:    Fear of current or ex partner: Not on file    Emotionally abused: Not on file    Physically abused: Not on file    Forced sexual activity: Not on file  Other Topics Concern  . Not on file  Social History Narrative   Lives with her husband. Her daughter lives nearby, and requires assistance due to relapse of Lyme Disease, undergoing IV treatment with a physician in [place], [place].   Family History  Problem Relation Age of Onset  . Cancer Mother        brain tumor  . Heart disease Father        heart attack  . Stroke Maternal Grandmother   . Stroke Maternal Grandfather   . Cancer Paternal Grandmother        Breast  . Heart attack Paternal Grandfather        Objective:    BP 124/82   Pulse 78   Temp 98 F (36.7 C) (Oral)   Resp 16   Ht 5' 3.78" (1.62 m)   Wt 172 lb (78 kg)   SpO2 96%   BMI 29.73 kg/m  Physical Exam  Constitutional: She is oriented to person, place, and time. She appears well-developed and well-nourished. No distress.  HENT:  Head: Normocephalic and atraumatic.  Right Ear: Hearing, tympanic membrane and ear canal normal. No drainage or swelling.  Left Ear: Hearing, tympanic membrane and ear canal normal. No drainage or swelling.  Mouth/Throat: Uvula is midline and mucous membranes are normal. No oral lesions. No uvula swelling. Posterior oropharyngeal erythema present. No oropharyngeal exudate, posterior oropharyngeal edema or tonsillar abscesses. Tonsils are 0 on the right. Tonsils are 0 on the left. No tonsillar exudate.  Eyes: Pupils are equal, round, and reactive to light. Conjunctivae are normal.  Neck: Normal range of motion. Neck supple.  Cardiovascular: Normal rate, regular rhythm and normal heart sounds. Exam reveals no gallop and no friction rub.  No murmur heard. Pulmonary/Chest: Effort normal and breath sounds normal. She has no wheezes. She has no rales.  Lymphadenopathy:    She has  cervical adenopathy.  Neurological: She is alert and oriented to person, place, and time.  Skin: She is not diaphoretic.  Psychiatric: She has a normal mood and affect. Her behavior is normal.  Nursing note and vitals reviewed.  No results found. Depression screen [facility] 2/9 04/16/2018 01/16/2017  Decreased Interest 0 0  Down, Depressed, Hopeless 0 0  PHQ - 2 Score 0 0   Fall Risk  04/16/2018 01/16/2017  Falls in the past year? No No        Assessment &  Plan:   1. Sore throat   2. Gastroesophageal reflux disease without esophagitis   3. Sprain of temporomandibular joint, initial encounter   4. Caregiver stress     Sore throat: New onset.  Rapid strep negative.  Send throat culture.  Treat empirically with penicillin 3 times daily while awaiting throat culture per patient request.  Patient also with a history of reflux and TMJ.  Acute stress reaction to providing care to chronically ill daughter.  Recommend wearing mouthguard at night.  Also recommend compliance with current therapy.  Return to clinic in ability to swallow.  Supportive measures with Tylenol.  Orders Placed This Encounter  Procedures  . Culture, Group A Strep    Order Specific Question:   Source    Answer:   oropharynx  . POCT rapid strep A   Meds ordered this encounter  Medications  . penicillin v potassium (VEETID) 250 MG tablet    Sig: Take 1 tablet (250 mg total) by mouth 3 (three) times daily.    Dispense:  30 tablet    Refill:  0    Return if symptoms worsen or fail to improve.   Adiba Fargnoli Elayne Guerin, M.D. Primary Care at [facility] previously [facility] [address] [place], [place]  [address] [phone] phone [phone] fax

## 2018-04-19 LAB — CULTURE, GROUP A STREP: STREP A CULTURE: NEGATIVE

## 2018-06-12 ENCOUNTER — Encounter (HOSPITAL_BASED_OUTPATIENT_CLINIC_OR_DEPARTMENT_OTHER): Payer: Self-pay | Admitting: Emergency Medicine

## 2018-06-12 ENCOUNTER — Other Ambulatory Visit: Payer: Self-pay

## 2018-06-12 ENCOUNTER — Emergency Department (HOSPITAL_BASED_OUTPATIENT_CLINIC_OR_DEPARTMENT_OTHER): Payer: Medicare Other

## 2018-06-12 ENCOUNTER — Emergency Department (HOSPITAL_BASED_OUTPATIENT_CLINIC_OR_DEPARTMENT_OTHER)
Admission: EM | Admit: 2018-06-12 | Discharge: 2018-06-13 | Disposition: A | Discharge status: Home / Self Care | Payer: Medicare Other | Attending: Emergency Medicine | Admitting: Emergency Medicine

## 2018-06-12 DIAGNOSIS — H509 Unspecified strabismus: Secondary | ICD-10-CM | POA: Diagnosis not present

## 2018-06-12 DIAGNOSIS — R42 Dizziness and giddiness: Secondary | ICD-10-CM

## 2018-06-12 DIAGNOSIS — D329 Benign neoplasm of meninges, unspecified: Secondary | ICD-10-CM | POA: Insufficient documentation

## 2018-06-12 DIAGNOSIS — Z91040 Latex allergy status: Secondary | ICD-10-CM | POA: Diagnosis not present

## 2018-06-12 DIAGNOSIS — Z79899 Other long term (current) drug therapy: Secondary | ICD-10-CM | POA: Diagnosis not present

## 2018-06-12 LAB — CBC WITH DIFFERENTIAL/PLATELET
BASOS PCT: 0 %
Basophils Absolute: 0 10*3/uL (ref 0.0–0.1)
EOS ABS: 0 10*3/uL (ref 0.0–0.7)
Eosinophils Relative: 0 %
HEMATOCRIT: 43.3 % (ref 36.0–46.0)
Hemoglobin: 14.6 g/dL (ref 12.0–15.0)
LYMPHS ABS: 1.9 10*3/uL (ref 0.7–4.0)
LYMPHS PCT: 17 %
MCH: 28.5 pg (ref 26.0–34.0)
MCHC: 33.7 g/dL (ref 30.0–36.0)
MCV: 84.4 fL (ref 78.0–100.0)
Monocytes Absolute: 0.4 10*3/uL (ref 0.1–1.0)
Monocytes Relative: 3 %
NEUTROS PCT: 80 %
Neutro Abs: 8.6 10*3/uL — ABNORMAL HIGH (ref 1.7–7.7)
Platelets: 303 10*3/uL (ref 150–400)
RBC: 5.13 MIL/uL — ABNORMAL HIGH (ref 3.87–5.11)
RDW: 13.4 % (ref 11.5–15.5)
WBC: 10.9 10*3/uL — ABNORMAL HIGH (ref 4.0–10.5)

## 2018-06-12 LAB — COMPREHENSIVE METABOLIC PANEL
ALBUMIN: 4.1 g/dL (ref 3.5–5.0)
ALT: 24 U/L (ref 0–44)
ANION GAP: 13 (ref 5–15)
AST: 33 U/L (ref 15–41)
Alkaline Phosphatase: 104 U/L (ref 38–126)
BUN: 14 mg/dL (ref 8–23)
CO2: 23 mmol/L (ref 22–32)
Calcium: 9.4 mg/dL (ref 8.9–10.3)
Chloride: 102 mmol/L (ref 98–111)
Creatinine, Ser: 0.72 mg/dL (ref 0.44–1.00)
GFR calc non Af Amer: >60 mL/min (ref >60)
GLUCOSE: 118 mg/dL — AB (ref 70–99)
POTASSIUM: 3.7 mmol/L (ref 3.5–5.1)
Sodium: 138 mmol/L (ref 135–145)
TOTAL PROTEIN: 7.9 g/dL (ref 6.5–8.1)
Total Bilirubin: 0.7 mg/dL (ref 0.3–1.2)

## 2018-06-12 LAB — URINALYSIS, ROUTINE W REFLEX MICROSCOPIC
Bilirubin Urine: NEGATIVE
Glucose, UA: NEGATIVE mg/dL
Ketones, ur: >80 mg/dL — AB
LEUKOCYTES UA: NEGATIVE
Nitrite: NEGATIVE
PH: 8.5 — AB (ref 5.0–8.0)
Protein, ur: 30 mg/dL — AB
SPECIFIC GRAVITY, URINE: 1.015 (ref 1.005–1.030)

## 2018-06-12 LAB — URINALYSIS, MICROSCOPIC (REFLEX)

## 2018-06-12 LAB — CBG MONITORING, ED: Glucose-Capillary: 117 mg/dL — ABNORMAL HIGH (ref 70–99)

## 2018-06-12 LAB — TROPONIN I: Troponin I: <0.03 ng/mL (ref <0.03)

## 2018-06-12 MED ORDER — SODIUM CHLORIDE 0.9 % IV BOLUS
1000.0000 mL | Freq: Once | INTRAVENOUS | Status: AC
  Administered 2018-06-12: 1000 mL via INTRAVENOUS

## 2018-06-12 MED ORDER — MECLIZINE HCL 25 MG PO TABS: 25.0000 mg | ORAL_TABLET | Freq: Three times a day (TID) | ORAL | 0 refills | Status: DC | PRN

## 2018-06-12 MED ORDER — IOPAMIDOL (ISOVUE-300) INJECTION 61%
100.0000 mL | Freq: Once | INTRAVENOUS | Status: AC | PRN
  Administered 2018-06-12: 80 mL via INTRAVENOUS

## 2018-06-12 MED ORDER — MECLIZINE HCL 25 MG PO TABS
25.0000 mg | ORAL_TABLET | Freq: Once | ORAL | Status: AC
  Administered 2018-06-12: 25 mg via ORAL
  Filled 2018-06-12: qty 1

## 2018-06-12 MED ORDER — PROMETHAZINE HCL 25 MG/ML IJ SOLN
12.5000 mg | Freq: Once | INTRAMUSCULAR | Status: AC
  Administered 2018-06-12: 12.5 mg via INTRAVENOUS
  Filled 2018-06-12: qty 1

## 2018-06-12 NOTE — Discharge Instructions (Addendum)
Take meclizine as needed for dizziness.   Your meningioma is unchanged in size.   I think your dizziness is likely from your strabismus. See Dr. Annamaria Boots next week   Consider following up with a neurologist. Talk to your doctor regarding referral   Return to ER if you have worse dizziness, trouble walking, trouble speaking, weakness.

## 2018-06-12 NOTE — ED Provider Notes (Signed)
[facility] EMERGENCY DEPARTMENT Provider Note   CSN: [id number] Arrival date & time: 06/12/18  1954     History   Chief Complaint Chief Complaint  Patient presents with  . Dizziness    HPI Deborah Archer is a 67 y.o. female history of reflux, meningioma, here presenting with dizziness.  Patient states that she has been dizzy for the last several days.  She states that she has been lightheaded.  She states that she did have multiple eye surgeries for strabismus and she does get dizzy occasionally.  She went to [facility] yesterday and travel back home and had only several hours of sleep last night.  She woke up this morning very tired and felt very dizzy.  He states that when she walks she feels unsteady but denies consistently falling on one side or the other.  Denies trouble speaking.  Denies any numbness or weakness.  Patient states that she has a known meningioma and has been followed with neurosurgery. Dr. Annette Stable. She had MRI in May 2019 that showed the meningioma was stable.   The history is provided by the patient.    Past Medical History:  Diagnosis Date  . Esotropia   . GERD (gastroesophageal reflux disease)   . Heart murmur   . Neuromuscular disorder ([place])   . 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 Doren Custard in 1987    Patient Active Problem List   Diagnosis Date Noted  . Degenerative disc disease, lumbar 01/16/2017  . Degenerative disc disease, cervical 01/16/2017  . Atrial septal defect 06/09/2014  . ANXIETY 03/27/2010  . GERD 03/27/2010  . CHANGE IN BOWELS 03/27/2010  . ABDOMINAL PAIN-RLQ 03/27/2010    Past Surgical History:  Procedure Laterality Date  . ASD REPAIR  1986  . CARPAL TUNNEL RELEASE Right 2009   Dr. Trenton Gammon  . CERVICAL SPINE SURGERY  2002   diskectomy, Dr. Trenton Gammon  . EYE SURGERY Bilateral 2010, 2015   VI nerve paresis  . STRABISMUS SURGERY Left 10/23/2017   Procedure: STRABISMUS REPAIR LEFT EYE;   Surgeon: Everitt Amber, MD;  Location: [facility];  Service: Ophthalmology;  Laterality: Left;     OB History   None      Home Medications    Prior to Admission medications   Medication Sig Start Date End Date Taking? Authorizing Provider  Acetaminophen (TYLENOL 8 HOUR PO) Take by mouth.    [provider]  lansoprazole (PREVACID) 15 MG capsule Take 15 mg by mouth daily. OTC    [provider]  penicillin v potassium (VEETID) 250 MG tablet Take 1 tablet (250 mg total) by mouth 3 (three) times daily. 04/16/18   Wardell Honour, MD    Family History Family History  Problem Relation Age of Onset  . Cancer Mother        brain tumor  . Heart disease Father        heart attack  . Stroke Maternal Grandmother   . Stroke Maternal Grandfather   . Cancer Paternal Grandmother        Breast  . Heart attack Paternal Grandfather     Social History Social History   Tobacco Use  . Smoking status: Never Smoker  . Smokeless tobacco: Never Used  Substance Use Topics  . Alcohol use: No    Alcohol/week: 0.0 standard drinks  . Drug use: No     Allergies   Amoxicillin; Nsaids; and Latex  Review of Systems Review of Systems  Neurological: Positive for dizziness.  All other systems reviewed and are negative.    Physical Exam Updated Vital Signs BP (!) 142/81   Pulse 72   Temp 98.6 F (37 C)   Resp 18   Ht 5' 3.5" (1.613 m)   Wt 79.8 kg   SpO2 100%   BMI 30.69 kg/m   Physical Exam  Constitutional: She is oriented to person, place, and time.  Anxious   HENT:  Head: Normocephalic.  Eyes:  No obvious nystagmus. But patient had previous strabismus surgery so can't look to the left. Extra ocular movements otherwise intact   Neck: Normal range of motion. Neck supple.  Cardiovascular: Normal rate, regular rhythm and normal heart sounds.  Pulmonary/Chest: Effort normal and breath sounds normal. No stridor. No respiratory distress.    Abdominal: Soft. Bowel sounds are normal. She exhibits no distension. There is no tenderness.  Musculoskeletal: Normal range of motion.  Neurological: She is alert and oriented to person, place, and time.  Nl strength and sensation throughout. CN 2- 12 intact. Nl gait. No pronator drift   Skin: Skin is warm.  Psychiatric: She has a normal mood and affect.  Nursing note and vitals reviewed.    ED Treatments / Results  Labs (all labs ordered are listed, but only abnormal results are displayed) Labs Reviewed  URINALYSIS, ROUTINE W REFLEX MICROSCOPIC - Abnormal; Notable for the following components:      Result Value   pH 8.5 (*)    Hgb urine dipstick TRACE (*)    Ketones, ur >80 (*)    Protein, ur 30 (*)    All other components within normal limits  CBC WITH DIFFERENTIAL/PLATELET - Abnormal; Notable for the following components:   WBC 10.9 (*)    RBC 5.13 (*)    Neutro Abs 8.6 (*)    All other components within normal limits  COMPREHENSIVE METABOLIC PANEL - Abnormal; Notable for the following components:   Glucose, Bld 118 (*)    All other components within normal limits  URINALYSIS, MICROSCOPIC (REFLEX) - Abnormal; Notable for the following components:   Bacteria, UA FEW (*)    All other components within normal limits  CBG MONITORING, ED - Abnormal; Notable for the following components:   Glucose-Capillary 117 (*)    All other components within normal limits  TROPONIN I    EKG EKG Interpretation  Date/Time:  Saturday June 12 2018 20:13:36 EDT Ventricular Rate:  66 PR Interval:  152 QRS Duration: 80 QT Interval:  426 QTC Calculation: 446 R Axis:   2 Text Interpretation:  Normal sinus rhythm Normal ECG No previous ECGs available Confirmed by Wandra Arthurs [phone]) on 06/12/2018 9:27:41 PM   Radiology Ct Head W Or Wo Contrast  Result Date: 06/12/2018 CLINICAL DATA:  Vertigo and difficulty walking. History of meningioma. EXAM: CT HEAD WITHOUT AND WITH CONTRAST  TECHNIQUE: Contiguous axial images were obtained from the base of the skull through the vertex without and with intravenous contrast CONTRAST:  93mL ISOVUE-300 IOPAMIDOL (ISOVUE-300) INJECTION 61% COMPARISON:  Brain MRI 02/25/2018 FINDINGS: Brain: No hemorrhage or extra-axial collection. The sessile meningioma adjacent to the left cavernous sinus and extending into the left ambient cistern is unchanged in size, measuring 2.2 x 0.5 cm on sagittal images. The size and configuration of the ventricles and extra-axial CSF spaces are normal. There is no acute or chronic infarction. There is mild hypoattenuation of the periventricular white matter, most commonly  indicating chronic ischemic microangiopathy. Vascular: No abnormal hyperdensity of the major intracranial arteries or dural venous sinuses. No intracranial atherosclerosis. Skull: The visualized skull base, calvarium and extracranial soft tissues are normal. Sinuses/Orbits: No fluid levels or advanced mucosal thickening of the visualized paranasal sinuses. No mastoid or middle ear effusion. The orbits are normal. IMPRESSION: 1. Unchanged appearance of sessile meningioma adjacent to the left cavernous sinus. 2. No acute abnormality. 3. Mild periventricular hypoattenuation suggesting mild chronic small vessel disease. Electronically Signed   By: Ulyses Jarred M.D.   On: 06/12/2018 22:48    Procedures Procedures (including critical care time)  Medications Ordered in ED Medications  promethazine (PHENERGAN) injection 12.5 mg (12.5 mg Intravenous Given 06/12/18 2156)  sodium chloride 0.9 % bolus 1,000 mL (1,000 mLs Intravenous New Bag/Given 06/12/18 2156)  meclizine (ANTIVERT) tablet 25 mg (25 mg Oral Given 06/12/18 2227)  iopamidol (ISOVUE-300) 61 % injection 100 mL (80 mLs Intravenous Contrast Given 06/12/18 2205)     Initial Impression / Assessment and Plan / ED Course  I have reviewed the triage vital signs and the nursing notes.  Pertinent labs & imaging  results that were available during my care of the patient were reviewed by me and considered in my medical decision making (see chart for details).     Latonyia Lopata is a 67 y.o. female here with dizziness. Likely multifactorial. She had multiple strabismus surgeries so likely has peripheral vertigo. Also has known meningioma and had recent travel and lack of sleep. She has nl neuro exam, low suspicion for posterior circulation stroke. Will get labs, CT head w/wo to assess for meningioma. Will hydrate and give meclizine and reassess.   11:02 PM Labs unremarkable. CT head w/wo showed stable meningioma. Felt better after meclizine, IVF. She ambulated well to the bathroom by herself. I think dizziness likely from the strabismus. She has follow up with Dr. Annamaria Boots, her eye doctor. I also recommend neurology follow up and she wants to get a referral from her doctor or from her neurosurgeon, Dr. Annette Stable.   Final Clinical Impressions(s) / ED Diagnoses   Final diagnoses:  None    ED Discharge Orders    None       Drenda Freeze, MD 06/12/18 2304

## 2018-06-12 NOTE — ED Triage Notes (Addendum)
Reports difficulty walking straight since 0815 today. (Walking into R wall). Denies dizziness. Reports vomiting x 1, states "I feel fine laying down". Denies changes with speech or new confusion. Denies weakness except general fatigue and tiredness. Pt with no difficulty articulating during triage. Reports sensitivity to smells, sounds and light.

## 2018-06-12 NOTE — ED Notes (Signed)
Dr. Darl Householder back in to speak with pt. at length regarding d/c plan. Pt voiced understanding.

## 2018-11-01 ENCOUNTER — Ambulatory Visit: Payer: Self-pay | Admitting: Surgery

## 2018-11-01 ENCOUNTER — Encounter: Payer: Self-pay | Admitting: Surgery

## 2018-11-01 DIAGNOSIS — N3010 Interstitial cystitis (chronic) without hematuria: Secondary | ICD-10-CM

## 2018-11-01 DIAGNOSIS — K439 Ventral hernia without obstruction or gangrene: Secondary | ICD-10-CM

## 2018-11-01 NOTE — H&P (Signed)
Deborah Archer Documented: 11/01/2018 9:47 AM Location: [facility] Patient #: [id number] DOB: 30-Nov-1950 Married / Language: Deborah Archer / Race: White Female  History of Present Illness [name] MD; 11/01/2018 1:13 PM) The patient is a 68 year old female who presents with an umbilical hernia. Note for "Umbilical hernia": ` ` ` Patient sent for surgical consultation at the request of Dr Leilani Merl, [facility]  Chief Complaint: Enlarging mass in upper abdomen. Concern for worsening hernia. ` ` The patient is a pleasant chatty woman that has had numerous health issues. 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 She noticed a pop and discomfort in 2003 upper part of her abdomen. The lowest part of her incision. She thinks it is related to physical therapy. She is felt a lump there ever since. She recalls seeing Dr. Rise Patience with our group in the past about this. Not particularly larger bothersome, recommendation for surgery was held off. However it is gone progressively larger. It is now obviously noticeable. It is more sensitive. Can bother her when she stretching or bending. Bumps into it. Because it is gradually gotten larger, she wish to reconsider surgical repair. Consultation requested.  She does not smoke. She is not diabetic. She's not had any cardiac issues since her septal defect repair. Not on blood thinners. No dysrhythmias. He can walk 20-30 minutes a day without difficulty. 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 She is quite certain that I took out her daughters gallbladder on 2011. He have no record of this yet. We are looking to just in case. Patient notes she can have a sensitive stomach. Usually moves her bowels most days. No history of infections. No other abdominal surgeries.  (Review of systems as stated in this history (HPI) or in the review  of systems. Otherwise all other 12 point ROS are negative) ` ` `   Past Surgical History [name], CMA; 11/01/2018 9:48 AM) Colon Polyp Removal - Colonoscopy Oral Surgery Spinal Surgery - Neck  Diagnostic Studies History [name], CMA; 11/01/2018 9:48 AM) Colonoscopy 5-10 years ago Mammogram within last year Pap Smear >5 years ago  Allergies [name], CMA; 11/01/2018 9:50 AM) NSAIDs Penicillins Protonix *ULCER DRUGS/ANTISPASMODICS/ANTICHOLINERGICS*  Medication History ([name], CMA; 11/01/2018 9:50 AM) [facility] Calcium (600MG  Tablet, Oral) Active. Pravastatin Sodium (20MG  Tablet, Oral) Active. Vitamin D (Ergocalciferol) (50000UNIT Capsule, Oral) Active. Lansoprazole (15MG  Capsule DR, Oral) Active. Medications Reconciled  Social History [name], CMA; 11/01/2018 9:48 AM) Caffeine use Carbonated beverages, Tea. No alcohol use No drug use Tobacco use Never smoker.  Family History [name], CMA; 11/01/2018 9:48 AM) Family history unknown First Degree Relatives  Pregnancy / Birth History [name], CMA; 11/01/2018 9:48 AM) Age at menarche 66 years. Age of menopause 50-55 Contraceptive History Oral contraceptives. Gravida 1 Length (months) of breastfeeding 3-6 Maternal age 24-35 Para 1  Other Problems [name], CMA; 11/01/2018 9:48 AM) Anxiety Disorder Back Pain Gastroesophageal Reflux Disease Heart murmur Hemorrhoids Hypercholesterolemia Ventral Hernia Repair     Review of Systems ([name] CMA; 11/01/2018 9:48 AM) General Not Present- Appetite Loss, Chills, Fatigue, Fever, Night Sweats, Weight Gain and Weight Loss. Skin Not Present- Change in Wart/Mole, Dryness, Hives, Jaundice, New Lesions, Non-Healing Wounds, Rash and Ulcer. HEENT Present- Wears glasses/contact lenses. Not Present- Earache, Hearing Loss, Hoarseness, Nose Bleed, Oral Ulcers, Ringing in the Ears,  Seasonal Allergies, Sinus Pain, Sore Throat,  Visual Disturbances and Yellow Eyes. Respiratory Not Present- Bloody sputum, Chronic Cough, Difficulty Breathing, Snoring and Wheezing. Breast Not Present- Breast Mass, Breast Pain, Nipple Discharge and Skin Changes. Cardiovascular Not Present- Chest Pain, Difficulty Breathing Lying Down, Leg Cramps, Palpitations, Rapid Heart Rate, Shortness of Breath and Swelling of Extremities. Gastrointestinal Present- Bloating, Excessive gas, Hemorrhoids and Indigestion. Not Present- Abdominal Pain, Bloody Stool, Change in Bowel Habits, Chronic diarrhea, Constipation, Difficulty Swallowing, Gets full quickly at meals, Nausea, Rectal Pain and Vomiting. Female Genitourinary Present- Frequency and Urgency. Not Present- Nocturia, Painful Urination and Pelvic Pain. Musculoskeletal Not Present- Back Pain, Joint Pain, Joint Stiffness, Muscle Pain, Muscle Weakness and Swelling of Extremities. Neurological Not Present- Decreased Memory, Fainting, Headaches, Numbness, Seizures, Tingling, Tremor, Trouble walking and Weakness. Psychiatric Present- Anxiety. Not Present- Bipolar, Change in Sleep Pattern, Depression, Fearful and Frequent crying. Endocrine Present- Hair Changes. Not Present- Cold Intolerance, Excessive Hunger, Heat Intolerance, Hot flashes and New Diabetes. Hematology Present- Easy Bruising. Not Present- Blood Thinners, Excessive bleeding, Gland problems, HIV and Persistent Infections.  Vitals ([name] CMA; 11/01/2018 9:48 AM) 11/01/2018 9:48 AM Weight: 177 lb Height: 63in Body Surface Area: 1.84 m Body Mass Index: 31.35 kg/m  Pulse: 80 (Regular)  BP: 128/80 (Sitting, Left Arm, Standard)      Physical Exam [name] MD; 11/01/2018 10:03 AM)  General Mental Status-Alert. General Appearance-Not in acute distress, Not Sickly. Orientation-Oriented X3. Hydration-Well hydrated. Voice-Normal.  Integumentary Global  Assessment Upon inspection and palpation of skin surfaces of the - Axillae: non-tender, no inflammation or ulceration, no drainage. and Distribution of scalp and body hair is normal. General Characteristics Temperature - normal warmth is noted.  Head and Neck Head-normocephalic, atraumatic with no lesions or palpable masses. Face Global Assessment - atraumatic, no absence of expression. Neck Global Assessment - no abnormal movements, no bruit auscultated on the right, no bruit auscultated on the left, no decreased range of motion, non-tender. Trachea-midline. Thyroid Gland Characteristics - non-tender.  Eye Eyeball - Left-Disconjugate gaze, No Nystagmus. Eyeball - Right-Extraocular movements intact, No Nystagmus. Cornea - Left-No Hazy. Cornea - Right-No Hazy. Sclera/Conjunctiva - Left-No scleral icterus, No Discharge. Sclera/Conjunctiva - Right-No scleral icterus, No Discharge. Pupil - Left-Direct reaction to light normal. Pupil - Right-Direct reaction to light normal. Note: Left eye cannot do lateral gaze past midline. Otherwise extraocular movements intact.  ENMT Ears Pinna - Left - no drainage observed, no generalized tenderness observed. Right - no drainage observed, no generalized tenderness observed. Nose and Sinuses External Inspection of the Nose - no destructive lesion observed. Inspection of the nares - Left - quiet respiration. Right - quiet respiration. Mouth and Throat Lips - Upper Lip - no fissures observed, no pallor noted. Lower Lip - no fissures observed, no pallor noted. Nasopharynx - no discharge present. Oral Cavity/Oropharynx - Tongue - no dryness observed. Oral Mucosa - no cyanosis observed. Hypopharynx - no evidence of airway distress observed.  Chest and Lung Exam Inspection Movements - Normal and Symmetrical. Accessory muscles - No use of accessory muscles in breathing. Palpation Palpation of the chest reveals -  Non-tender. Auscultation Breath sounds - Normal and Clear.  Cardiovascular Auscultation Rhythm - Regular. Murmurs & Other Heart Sounds - Auscultation of the heart reveals - No Murmurs and No Systolic Clicks.  Abdomen Inspection Inspection of the abdomen reveals - No Visible peristalsis and No Abnormal pulsations. Umbilicus - No Bleeding, No Urine drainage. Palpation/Percussion Palpation and Percussion of the abdomen reveal - Soft, Non Tender, No Rebound tenderness, No  Rigidity (guarding) and No Cutaneous hyperesthesia. Note: 7 x 6 cm mass subxiphoid region. Consistent with incarcerated incisional hernia. Mildly sensitive.  Abdomen obese but soft. Not distended. No distasis recti. No umbilical or other anterior abdominal wall hernias  Female Genitourinary Sexual Maturity Tanner 5 - Adult hair pattern. Note: No inguinal hernias. No lymphadenopathy. No vaginal bleeding nor discharge  Peripheral Vascular Upper Extremity Inspection - Left - No Cyanotic nailbeds, Not Ischemic. Right - No Cyanotic nailbeds, Not Ischemic.  Neurologic Neurologic evaluation reveals -normal attention span and ability to concentrate, able to name objects and repeat phrases. Appropriate fund of knowledge , normal sensation and normal coordination. Mental Status Affect - not angry, not paranoid. Cranial Nerves-Normal Bilaterally. Gait-Normal.  Neuropsychiatric Mental status exam performed with findings of-able to articulate well with normal speech/language, rate, volume and coherence, thought content normal with ability to perform basic computations and apply abstract reasoning and no evidence of hallucinations, delusions, obsessions or homicidal/suicidal ideation.  Musculoskeletal Global Assessment Spine, Ribs and Pelvis - no instability, subluxation or laxity. Right Upper Extremity - no instability, subluxation or laxity.  Lymphatic Head & Neck  General Head & Neck Lymphatics: Bilateral -  Description - No Localized lymphadenopathy. Axillary  General Axillary Region: Bilateral - Description - No Localized lymphadenopathy. Femoral & Inguinal  Generalized Femoral & Inguinal Lymphatics: Left - Description - No Localized lymphadenopathy. Right - Description - No Localized lymphadenopathy.    Assessment & Plan [name] MD; 11/01/2018 1:13 PM)  Mickel Duhamel HERNIA (K43.0) Impression: Obvious abdominal wall mass correlating by CT scan and exam for hernia at the base of her prior sternotomy incision. Falciform ligament and fat incarcerated within it. Small in 2003/2005. Much more obvious now. Larger. Now incarcerated. Sensitive.  It is been increasing in size over the past few decades. His now becoming more noticeable. More sensitive and irritating. She wishes to consider repair. I think that is reasonable. The challenges that she is a caregiver for her daughter. Daughter getting a nerve stimulator surgery soon. Patient wants to delay planning surgery until she knows that skilling okay and has help in addition. I did caution the patient it'll be several weeks before she is at a place where she can more regularly take care of her daughter. The patient's had this for nearly 2 decades and has not severely debilitated this point, so she is time to sort out. 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   PREOP - [place] - ENCOUNTER FOR PREOPERATIVE EXAMINATION FOR GENERAL SURGICAL PROCEDURE (Z01.818)  Current Plans You are being scheduled for surgery- Our schedulers will call you.  You should hear from our office's scheduling department within 5 working days about the location, date, and time of surgery. We try to make accommodations for patient's preferences in scheduling surgery, but sometimes the OR schedule or the surgeon's  schedule prevents [place] from making those accommodations.  If you have not heard from our office [phone]) in 5 working days, call the office and ask for your surgeon's nurse.  If you have other questions about your diagnosis, plan, or surgery, call the office and ask for your surgeon's nurse.  Written instructions provided The anatomy & physiology of the abdominal wall was discussed. The pathophysiology of hernias was discussed. Natural history risks without surgery including progeressive enlargement, pain, incarceration, & strangulation was  discussed. Contributors to complications such as smoking, obesity, diabetes, prior surgery, etc were discussed.  I feel the risks of no intervention will lead to serious problems that outweigh the operative risks; therefore, I recommended surgery to reduce and repair the hernia. I explained laparoscopic techniques with possible need for an open approach. I noted the probable use of mesh to patch and/or buttress the hernia repair  Risks such as bleeding, infection, abscess, need for further treatment, heart attack, death, and other risks were discussed. I noted a good likelihood this will help address the problem. Goals of post-operative recovery were discussed as well. Possibility that this will not correct all symptoms was explained. I stressed the importance of low-impact activity, aggressive pain control, avoiding constipation, & not pushing through pain to minimize risk of post-operative chronic pain or injury. Possibility of reherniation especially with smoking, obesity, diabetes, immunosuppression, and other health conditions was discussed. We will work to minimize complications.  An educational handout further explaining the pathology & treatment options was given as well. Questions were answered. The patient expresses understanding & wishes to proceed with surgery.  Pt Education - [facility] Hernia Post-Op HCI (Lamorris Knoblock): discussed with patient  and provided information. Pt Education - [facility] Pain Control (Vada Swift) Pt Education - Pamphlet Given - Laparoscopic Hernia Repair: discussed with patient and provided information. Pt Education - [facility] Mesh education: discussed with patient and provided information.  [name], MD, FACS, MASCRS Gastrointestinal and Minimally Invasive Surgery    [address], [place], [place] [address] [phone] Main / Paging [phone] Fax

## 2019-12-19 ENCOUNTER — Ambulatory Visit: Payer: Medicare Other

## 2019-12-19 ENCOUNTER — Ambulatory Visit: Payer: Medicare PPO | Attending: Internal Medicine

## 2019-12-19 DIAGNOSIS — Z23 Encounter for immunization: Secondary | ICD-10-CM

## 2019-12-19 NOTE — Progress Notes (Signed)
   Covid-19 Vaccination Clinic  Name:  Deborah Archer    MRN: [id number] DOB: 11-20-1950  12/19/2019  [name] was observed post Covid-19 immunization for 15 minutes without incident. She was provided with Vaccine Information Sheet and instruction to access the V-Safe system.   [name] was instructed to call 911 with any severe reactions post vaccine: Marland Kitchen Difficulty breathing  . Swelling of face and throat  . A fast heartbeat  . A bad rash all over body  . Dizziness and weakness   Immunizations Administered    Name Date Dose VIS Date Route   Pfizer COVID-19 Vaccine 12/19/2019  1:58 PM 0.3 mL 09/16/2019 Intramuscular   Manufacturer: [place]   Lot: [id number]   [place]: [id number]

## 2019-12-26 ENCOUNTER — Ambulatory Visit: Payer: Medicare Other

## 2020-01-11 ENCOUNTER — Ambulatory Visit: Payer: Medicare PPO | Attending: Internal Medicine

## 2020-01-11 DIAGNOSIS — Z23 Encounter for immunization: Secondary | ICD-10-CM

## 2020-01-11 NOTE — Progress Notes (Signed)
   Covid-19 Vaccination Clinic  Name:  Deborah Archer    MRN: [id number] DOB: [birth year]  01/11/2020  [name] was observed post Covid-19 immunization for 15 minutes without incident. She was provided with Vaccine Information Sheet and instruction to access the V-Safe system.   [name] was instructed to call 911 with any severe reactions post vaccine: Marland Kitchen Difficulty breathing  . Swelling of face and throat  . A fast heartbeat  . A bad rash all over body  . Dizziness and weakness   Immunizations Administered    Name Date Dose VIS Date Route   Pfizer COVID-19 Vaccine 01/11/2020  1:24 PM 0.3 mL 09/16/2019 Intramuscular   Manufacturer: [place]   Lot: [id number]   [place]: [id number]

## 2020-02-27 ENCOUNTER — Ambulatory Visit (HOSPITAL_COMMUNITY): Payer: Self-pay | Admitting: Surgery

## 2020-02-27 NOTE — H&P (Signed)
Deborah Archer Appointment: 02/27/2020 2:15 PM Location: [facility] Patient #: [id number] DOB: June 16, 1951 Married / Language: [name] / Race: White Female  History of Present Illness [name] MD; 02/27/2020 5:18 PM) The patient is a 69 year old female who presents with an umbilical hernia. Note for "Umbilical hernia": ` ` ` Patient sent for surgical consultation at the request of Dr Leilani Merl, [facility]  Chief Complaint: Enlarging mass in upper abdomen. Concern for worsening hernia. ` ` Patient returns. She held off on any hernia surgery with COVID pandemic. Also her daughter had some health concerns that took many months last year to try and help sort out. She feels like there hernias condyloma larger. She's been nervous about proceeding with surgery but also concern that has gotten worse. Comes back in to rediscuss considering hernia repair  PRIOR NOTE 2020: The patient is a pleasant chatty woman that has had numerous health issues. 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 She noticed a pop and discomfort in 2003 upper part of her abdomen. The lowest part of her incision. She thinks it is related to physical therapy. She is felt a lump there ever since. She recalls seeing Dr. Rise Patience with our group in the past about this. Not particularly larger bothersome, recommendation for surgery was held off. However it is gone progressively larger. It is now obviously noticeable. It is more sensitive. Can bother her when she stretching or bending. Bumps into it. Because it is gradually gotten larger, she wish to reconsider surgical repair. Consultation requested.  She does not smoke. She is not diabetic. She's not had any cardiac issues since her septal defect repair. Not on blood thinners. No dysrhythmias. He can walk 20-30 minutes a day without difficulty. 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 She is quite certain that I took out her daughters gallbladder on 2011. He have no record of this yet. We are looking to just in case. Patient notes she can have a sensitive stomach. Usually moves her bowels most days. No history of infections. No other abdominal surgeries.  (Review of systems as stated in this history (HPI) or in the review of systems. Otherwise all other 12 point ROS are negative) ` ` `   Problem List/Past Medical [name], MD; 02/27/2020 3:11 PM) Nira Conn INCISIONAL HERNIA (K43.0) PREOP - [place] - ENCOUNTER FOR PREOPERATIVE EXAMINATION FOR GENERAL SURGICAL PROCEDURE [id number])  Past Surgical History [name], MD; 02/27/2020 3:11 PM) Colon Polyp Removal - Colonoscopy Oral Surgery Spinal Surgery - Neck  Diagnostic Studies History [name], MD; 02/27/2020 3:11 PM) Colonoscopy 5-10 years ago Mammogram within last year Pap Smear >5 years ago  Allergies (Chanel Teressa Senter, CMA; 02/27/2020 2:48 PM) NSAIDs Penicillins Protonix *ULCER DRUGS/ANTISPASMODICS/ANTICHOLINERGICS* Allergies Reconciled  Medication History (Chanel Teressa Senter, CMA; 02/27/2020 2:48 PM) Pravastatin Sodium (20MG  Tablet, Oral) Active. Medications Reconciled  Social History [name], MD; 02/27/2020 3:11 PM) Caffeine use Carbonated beverages, Tea. No alcohol use No drug use Tobacco use Never smoker.  Family History [name], MD; 02/27/2020 3:11 PM) Family history unknown First Degree Relatives  Pregnancy / Birth History [name], MD; 02/27/2020 3:11 PM) Age at menarche 78 years. Age of menopause 50-55 Contraceptive History Oral contraceptives. Gravida 1 Length (months) of breastfeeding 3-6 Maternal age 61-35 Para 1  Other Problems [name], MD; 02/27/2020 3:11 PM) Anxiety Disorder Back  Pain Gastroesophageal Reflux Disease Heart  murmur Hemorrhoids Hypercholesterolemia Ventral Hernia Repair     Review of Systems [name], MD; 02/27/2020 3:11 PM) General Not Present- Appetite Loss, Chills, Fatigue, Fever, Night Sweats, Weight Gain and Weight Loss. Skin Not Present- Change in Wart/Mole, Dryness, Hives, Jaundice, New Lesions, Non-Healing Wounds, Rash and Ulcer. HEENT Present- Wears glasses/contact lenses. Not Present- Earache, Hearing Loss, Hoarseness, Nose Bleed, Oral Ulcers, Ringing in the Ears, Seasonal Allergies, Sinus Pain, Sore Throat, Visual Disturbances and Yellow Eyes. Respiratory Not Present- Bloody sputum, Chronic Cough, Difficulty Breathing, Snoring and Wheezing. Breast Not Present- Breast Mass, Breast Pain, Nipple Discharge and Skin Changes. Cardiovascular Not Present- Chest Pain, Difficulty Breathing Lying Down, Leg Cramps, Palpitations, Rapid Heart Rate, Shortness of Breath and Swelling of Extremities. Gastrointestinal Present- Bloating, Excessive gas, Hemorrhoids and Indigestion. Not Present- Abdominal Pain, Bloody Stool, Change in Bowel Habits, Chronic diarrhea, Constipation, Difficulty Swallowing, Gets full quickly at meals, Nausea, Rectal Pain and Vomiting. Female Genitourinary Present- Frequency and Urgency. Not Present- Nocturia, Painful Urination and Pelvic Pain. Musculoskeletal Not Present- Back Pain, Joint Pain, Joint Stiffness, Muscle Pain, Muscle Weakness and Swelling of Extremities. Neurological Not Present- Decreased Memory, Fainting, Headaches, Numbness, Seizures, Tingling, Tremor, Trouble walking and Weakness. Psychiatric Present- Anxiety. Not Present- Bipolar, Change in Sleep Pattern, Depression, Fearful and Frequent crying. Endocrine Present- Hair Changes. Not Present- Cold Intolerance, Excessive Hunger, Heat Intolerance, Hot flashes and New Diabetes. Hematology Present- Easy Bruising. Not Present- Blood Thinners, Excessive bleeding, Gland problems, HIV and Persistent  Infections.  Vitals (Chanel Nolan CMA; 02/27/2020 2:48 PM) 02/27/2020 2:48 PM Weight: 176.13 lb Height: 63in Body Surface Area: 1.83 m Body Mass Index: 31.2 kg/m  Temp.: 41F  Pulse: 98 (Regular)         Physical Exam [name] MD; 02/27/2020 3:32 PM)  General Mental Status-Alert. General Appearance-Not in acute distress, Not Sickly. Orientation-Oriented X3. Hydration-Well hydrated. Voice-Normal.  Integumentary Global Assessment Upon inspection and palpation of skin surfaces of the - Axillae: non-tender, no inflammation or ulceration, no drainage. and Distribution of scalp and body hair is normal. General Characteristics Temperature - normal warmth is noted.  Head and Neck Head-normocephalic, atraumatic with no lesions or palpable masses. Face Global Assessment - atraumatic, no absence of expression. Neck Global Assessment - no abnormal movements, no bruit auscultated on the right, no bruit auscultated on the left, no decreased range of motion, non-tender. Trachea-midline. Thyroid Gland Characteristics - non-tender.  Eye Eyeball - Left-Disconjugate gaze, No Nystagmus - Left. Eyeball - Right-Extraocular movements intact, No Nystagmus - Right. Cornea - Left-No Hazy - Left. Cornea - Right-No Hazy - Right. Sclera/Conjunctiva - Left-No scleral icterus, No Discharge - Left. Sclera/Conjunctiva - Right-No scleral icterus, No Discharge - Right. Pupil - Left-Direct reaction to light normal. Pupil - Right-Direct reaction to light normal. Note: Left eye cannot do lateral gaze past midline. Otherwise extraocular movements intact.  ENMT Ears Pinna - Left - no drainage observed, no generalized tenderness observed. Pinna - Right - no drainage observed, no generalized tenderness observed. Nose and Sinuses External Inspection of the Nose - no destructive lesion observed. Inspection of the nares - Left - quiet respiration.  Inspection of the nares - Right - quiet respiration. Mouth and Throat Lips - Upper Lip - no fissures observed, no pallor noted. Lower Lip - no fissures observed, no pallor noted. Nasopharynx - no discharge present. Oral Cavity/Oropharynx - Tongue - no dryness observed. Oral Mucosa - no cyanosis observed. Hypopharynx - no evidence of airway distress  observed.  Chest and Lung Exam Inspection Movements - Normal and Symmetrical. Accessory muscles - No use of accessory muscles in breathing. Palpation Palpation of the chest reveals - Non-tender. Auscultation Breath sounds - Normal and Clear.  Cardiovascular Auscultation Rhythm - Regular. Murmurs & Other Heart Sounds - Auscultation of the heart reveals - No Murmurs and No Systolic Clicks.  Abdomen Inspection Inspection of the abdomen reveals - No Visible peristalsis and No Abnormal pulsations. Umbilicus - No Bleeding, No Urine drainage. Palpation/Percussion Palpation and Percussion of the abdomen reveal - Soft, Non Tender, No Rebound tenderness, No Rigidity (guarding) and No Cutaneous hyperesthesia. Note: 6 x 6 cm mass subxiphoid region. Reducible to a smaller defect. Consistent with reducible epigastric incisional hernia most likely from prior sternotomy from her septal defect heart surgery 30 years ago  Abdomen obese but soft. Not distended. No daistasis recti. No umbilical or other anterior abdominal wall hernias  Female Genitourinary Sexual Maturity Tanner 5 - Adult hair pattern. Note: No inguinal hernias. No lymphadenopathy. No vaginal bleeding nor discharge  Peripheral Vascular Upper Extremity Inspection - Left - No Cyanotic nailbeds - Left, Not Ischemic. Inspection - Right - No Cyanotic nailbeds - Right, Not Ischemic.  Neurologic Neurologic evaluation reveals -normal attention span and ability to concentrate, able to name objects and repeat phrases. Appropriate fund of knowledge , normal sensation and normal  coordination. Mental Status Affect - not angry, not paranoid. Cranial Nerves-Normal Bilaterally. Gait-Normal.  Neuropsychiatric Mental status exam performed with findings of-able to articulate well with normal speech/language, rate, volume and coherence, thought content normal with ability to perform basic computations and apply abstract reasoning and no evidence of hallucinations, delusions, obsessions or homicidal/suicidal ideation.  Musculoskeletal Global Assessment Spine, Ribs and Pelvis - no instability, subluxation or laxity. Right Upper Extremity - no instability, subluxation or laxity.  Lymphatic Head & Neck General Head & Neck Lymphatics: Bilateral - Description - No Localized lymphadenopathy. Axillary General Axillary Region: Bilateral - Description - No Localized lymphadenopathy. Femoral & Inguinal Generalized Femoral & Inguinal Lymphatics: Left - Description - No Localized lymphadenopathy. Right - Description - No Localized lymphadenopathy.    Assessment & Plan [name] MD; 02/27/2020 3:34 PM)  INCISIONAL HERNIA, WITHOUT OBSTRUCTION OR GANGRENE (K43.2) Impression: Obvious abdominal wall mass correlating by CT scan and exam for hernia at the base of her prior sternotomy incision.  It is reducible. It does seem slightly larger from last year.  She is nervous and anxious about surgery but wants something done since she is having more symptoms now. She delayed surgery with a covered pandemic and also having to take care of her daughter with any special needs. Her daughter's issues are more stable now. Covid pandemic seems to be waning. She is wishing to try and proceed and get done hopefully in the next week or so. I cautioned that time but doubt a while so it may be more like in a month or so. I tried to reassure her that she should not be high risk and she has good exercise tolerance. We will try and work on her pain and discomfort post-op. I gave her extra time  to address her concerns and anxieties. That seemed to reassure her. I did caution that she needs time to recover with help at home. Again laparoscopic underlay repair. Primary closure over the defect itself. Hopefully can be an outpatient surgery. Might need to stay overnight depending on her pain and other issues. Hopefully good/nerve block well that out as well.  PREOP - [place] - ENCOUNTER FOR PREOPERATIVE EXAMINATION FOR GENERAL SURGICAL PROCEDURE (Z01.818)  Current Plans You are being scheduled for surgery- Our schedulers will call you.  You should hear from our office's scheduling department within 5 working days about the location, date, and time of surgery. We try to make accommodations for patient's preferences in scheduling surgery, but sometimes the OR schedule or the surgeon's schedule prevents [place] from making those accommodations.  If you have not heard from our office [phone]) in 5 working days, call the office and ask for your surgeon's nurse.  If you have other questions about your diagnosis, plan, or surgery, call the office and ask for your surgeon's nurse.  Written instructions provided [facility] Consent - Hernia Repair - Ventral/Incisional/Umbilical (Jonnette Nuon): discussed with patient and provided information. Pt Education - [facility] Hernia Post-Op HCI (Evarose Altland): discussed with patient and provided information. Pt Education - [facility] Pain Control (Giabella Duhart) Pt Education - Pamphlet Given - Laparoscopic Hernia Repair: discussed with patient and provided information. Pt Education - [facility] Mesh education: discussed with patient and provided information.  [name], MD, FACS, MASCRS Gastrointestinal and Minimally Invasive Surgery  [facility] [address], [place], [place] [address] [phone] Fax [phone] Main/Paging  CONTACT INFORMATION: Weekday (9AM-5PM) concerns: Call [facility] main office at [phone] Weeknight (5PM-9AM) or Weekend/Holiday  concerns: Check [email or web address] for General Surgery [facility] coverage (Please, do not use SecureChat as it is not reliable communication to operating surgeons for immediate patient care)

## 2020-03-14 DIAGNOSIS — N33 Bladder disorders in diseases classified elsewhere: Secondary | ICD-10-CM | POA: Insufficient documentation

## 2020-04-04 IMAGING — CT CT HEAD WO/W CM
4 of 8 series · 15 of 47 positions shown, 17 images · IV contrast (iopamidol)
Comparison: Brain MRI 02/25/2018

CLINICAL DATA: Vertigo and difficulty walking. History of
meningioma.

EXAM:
CT HEAD WITHOUT AND WITH CONTRAST
TECHNIQUE: Contiguous axial images were obtained from the base of the skull
through the vertex without and with intravenous contrast
CONTRAST:  80mL YZCTZ0-M22 IOPAMIDOL (YZCTZ0-M22) INJECTION 61%

[Series 2: head wo · axial · 0.44mm/px · z∈[-122,-52]mm · 4 of 29 slices shown]
[im 5/29  brain]
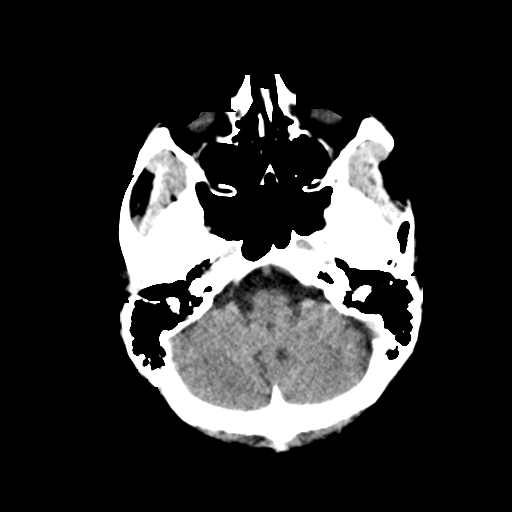
[im 10/29  brain]
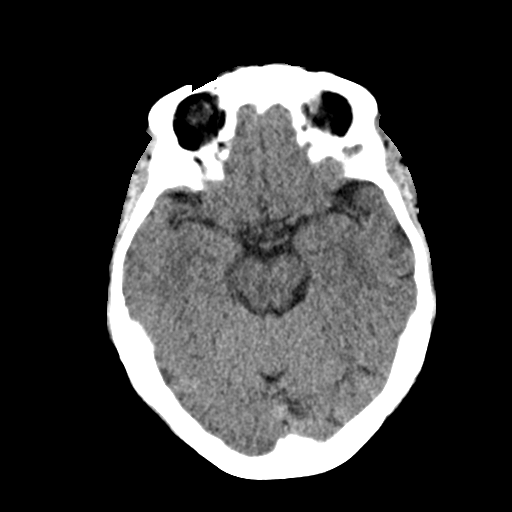
[im 15/29  brain]
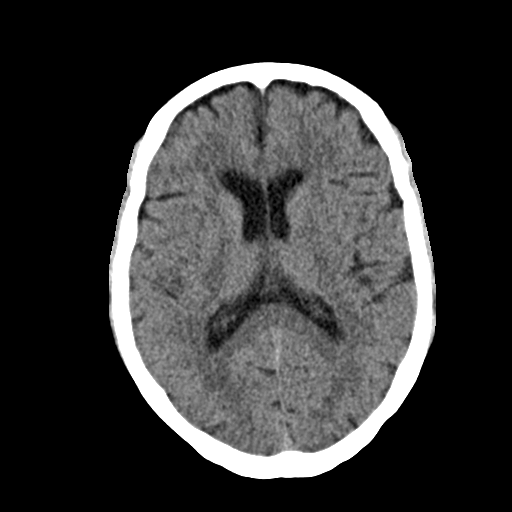
[im 19/29  brain]
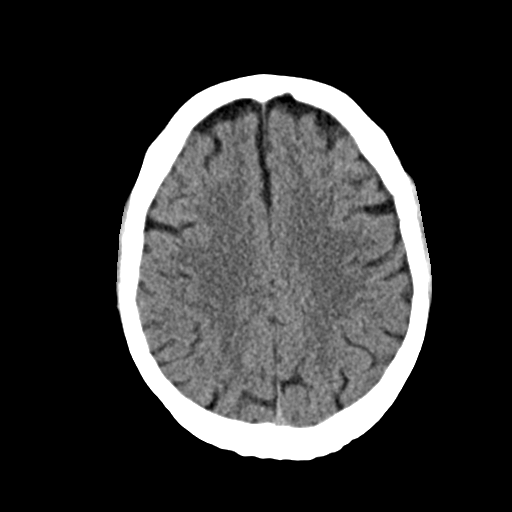

[Series 4: cor wo soft · coronal · 0.29mm/px · 3 of 66 slices shown]
[im 17/66  brain]
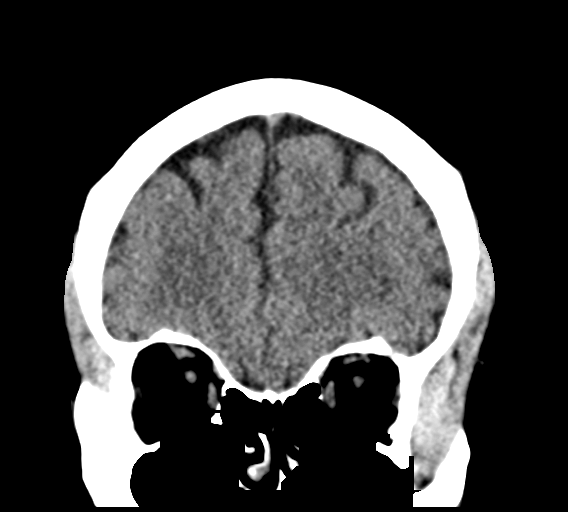
[im 33/66  brain]
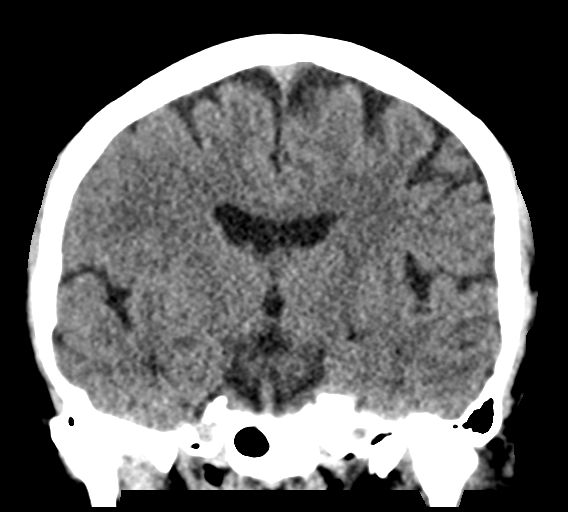
[im 49/66  brain]
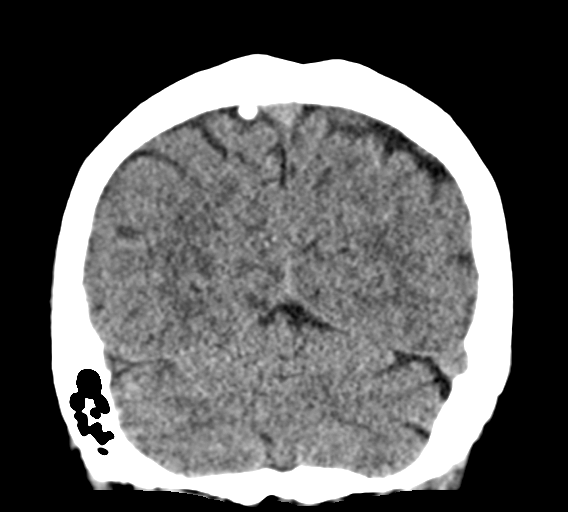

[Series 6: head w soft · axial · 0.44mm/px · z∈[-122,-22]mm · 6 of 29 slices shown, 8 images]
[im 5/29  brain]
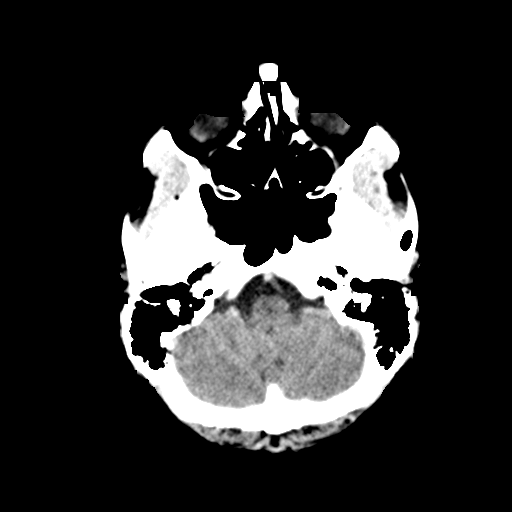
[im 5/29  bone]
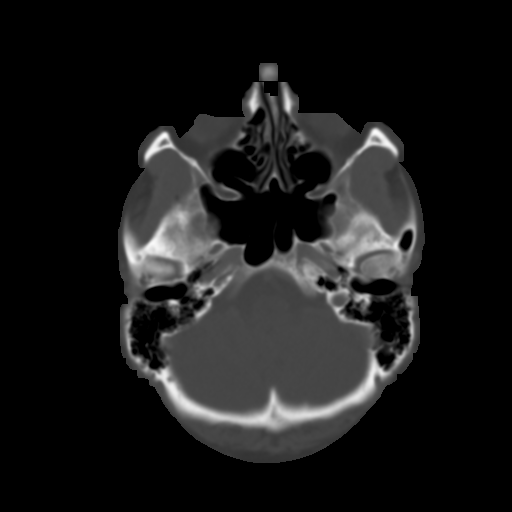
[im 9/29  brain]
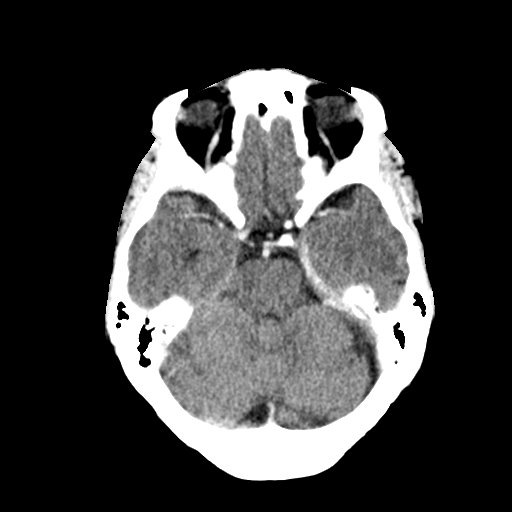
[im 13/29  brain]
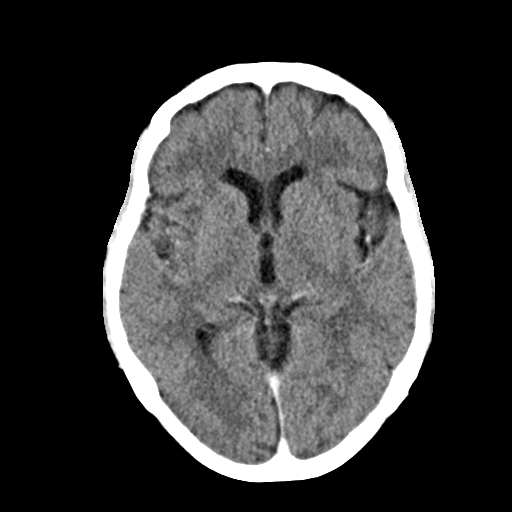
[im 17/29  brain]
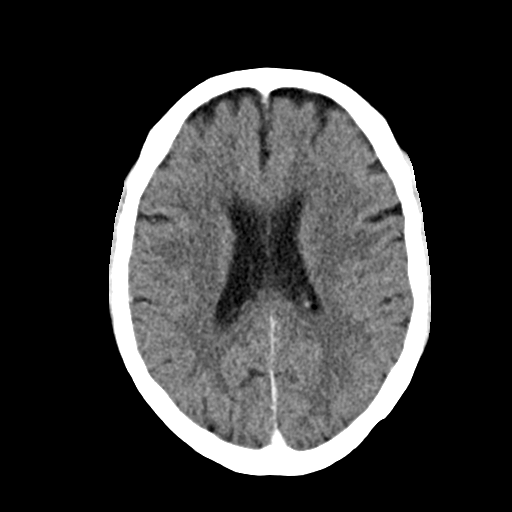
[im 21/29  brain]
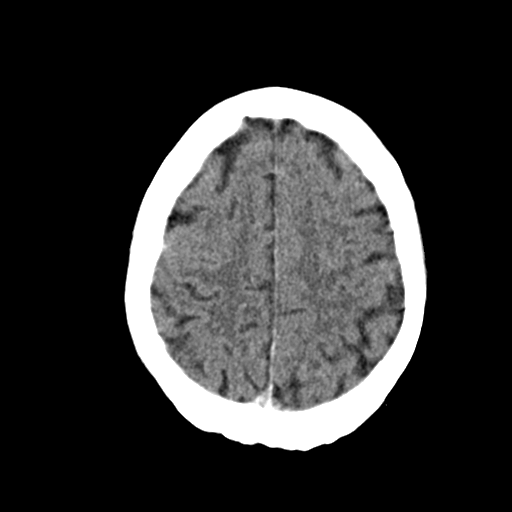
[im 21/29  bone]
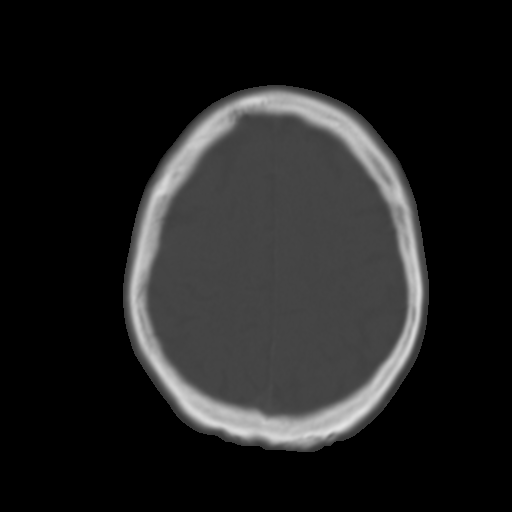
[im 25/29  brain]
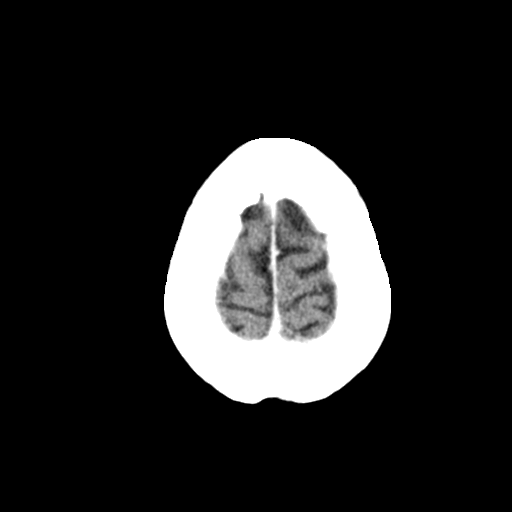

[Series 9: sag soft · sagittal · 0.28mm/px · 2 of 55 slices shown]
[im 19/55  brain]
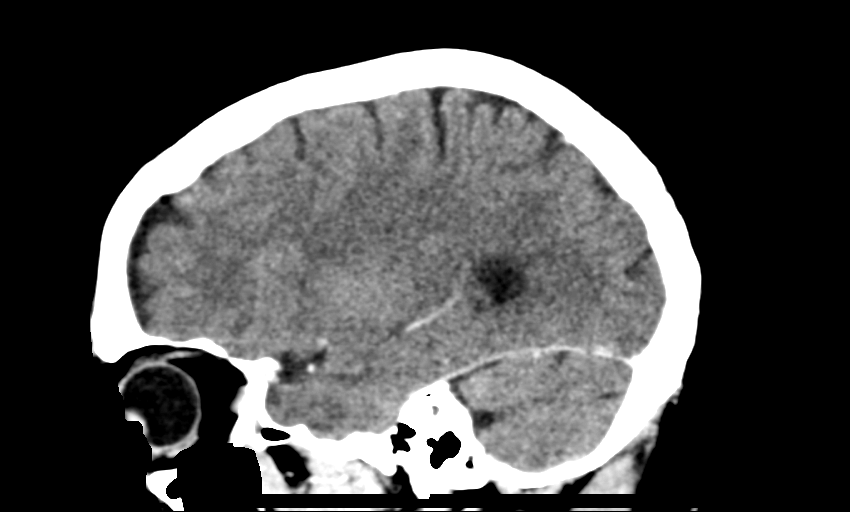
[im 37/55  brain]
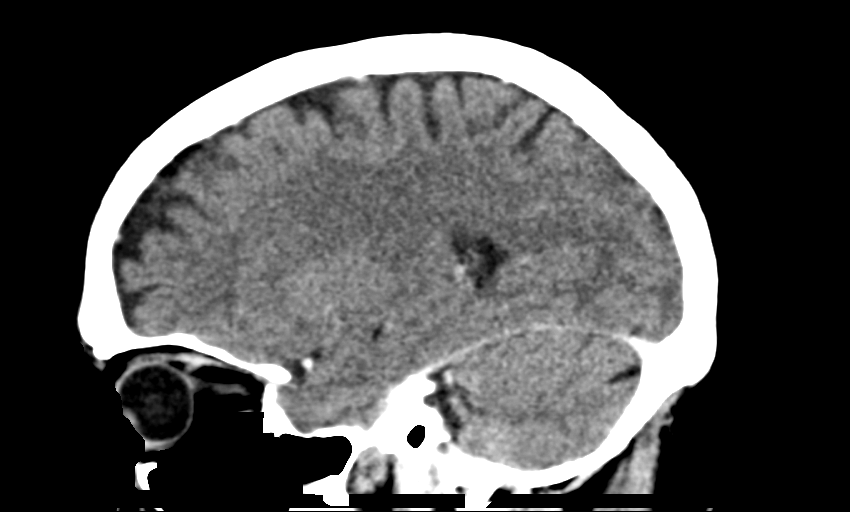

[15 of 47 positions shown; findings below may reference images not displayed]

FINDINGS: Brain: No hemorrhage or extra-axial collection. The sessile
meningioma adjacent to the left cavernous sinus and extending into
the left ambient cistern is unchanged in size, measuring 2.2 x
cm on sagittal images. The size and configuration of the ventricles
and extra-axial CSF spaces are normal. There is no acute or chronic
infarction. There is mild hypoattenuation of the periventricular
white matter, most commonly indicating chronic ischemic
microangiopathy.

Vascular: No abnormal hyperdensity of the major intracranial
arteries or dural venous sinuses. No intracranial atherosclerosis.

Skull: The visualized skull base, calvarium and extracranial soft
tissues are normal.

Sinuses/Orbits: No fluid levels or advanced mucosal thickening of
the visualized paranasal sinuses. No mastoid or middle ear effusion.
The orbits are normal.
IMPRESSION: 1. Unchanged appearance of sessile meningioma adjacent to the left
cavernous sinus.
2. No acute abnormality.
3. Mild periventricular hypoattenuation suggesting mild chronic
small vessel disease.

## 2020-04-04 NOTE — Progress Notes (Addendum)
COVID Vaccine Completed: Date COVID Vaccine completed: COVID vaccine manufacturer: [place]   PCP - [name], NP  Cardiologist - N/A No stimulator in back  Chest x-ray -  EKG -  Stress Test -  ECHO -  Cardiac Cath -   Sleep Study -  CPAP -   Fasting Blood Sugar -  Checks Blood Sugar _____ times a day  Blood Thinner Instructions: Aspirin Instructions: Last Dose:  Anesthesia review:   No SOB/DOE  Patient denies shortness of breath, fever, cough and chest pain at PAT appointment   Patient verbalized understanding of instructions that were given to them at the PAT appointment. Patient was also instructed that they will need to review over the PAT instructions again at home before surgery.

## 2020-04-04 NOTE — Patient Instructions (Addendum)
DUE TO COVID-19 ONLY ONE VISITOR IS ALLOWED TO COME WITH YOU AND STAY IN THE WAITING ROOM ONLY DURING PRE OP AND PROCEDURE DAY OF SURGERY. THE 1 VISITOR MAY VISIT WITH YOU AFTER SURGERY IN YOUR PRIVATE ROOM DURING VISITING HOURS ONLY!  YOU NEED TO HAVE A COVID 19 TEST ON 04-10-20 @ 11:00 AM, THIS TEST MUST BE DONE BEFORE SURGERY, COME  [address], [place] , [id number].  ([place]) ONCE YOUR COVID TEST IS COMPLETED, PLEASE BEGIN THE QUARANTINE INSTRUCTIONS AS OUTLINED IN YOUR HANDOUT.                Raena Pau  04/04/2020   Your procedure is scheduled on: 04-12-20   Report to [facility] Main  Entrance    Report to admitting at 7:00 AM     Call this number if you have problems the morning of surgery [phone]    Remember: DRINK 2 PRESURGERY ENSURE DRINKS THE NIGHT BEFORE SURGERY AT 1000 PM AND 1 PRESURGERY DRINK THE DAY OF THE PROCEDURE 3 HOURS PRIOR TO SCHEDULED SURGERY. NOTHING BY MOUTH EXCEPT CLEAR LIQUIDS UNTIL THREE HOURS PRIOR TO SCHEDULED SURGERY. PLEASE FINISH PRESURGERY ENSURE DRINK PER SURGEON ORDER 3 HOURS PRIOR TO SCHEDULED SURGERY TIME WHICH NEEDS TO BE COMPLETED AT 6:00 AM.     CLEAR LIQUID DIET   Foods Allowed                                                                     Foods Excluded  Coffee and tea, regular and decaf                             liquids that you cannot  Plain Jell-O any favor except red or purple                                           see through such as: Fruit ices (not with fruit pulp)                                     milk, soups, orange juice  Iced Popsicles                                    All solid food Carbonated beverages, regular and diet                                    Cranberry, grape and apple juices Sports drinks like Gatorade Lightly seasoned clear broth or consume(fat free) Sugar, honey syrup   _____________________________________________________________________    Take  these medicines the morning of surgery with A SIP OF WATER: Cetirizine (Zyrtec)  BRUSH YOUR TEETH MORNING OF SURGERY AND RINSE YOUR MOUTH OUT, NO CHEWING GUM CANDY OR MINTS.  You may not have any metal on your body including hair pins and              piercings     Do not wear jewelry, make-up, lotions, powders or perfumes, deodorant              Do not wear nail polish on your fingernails.  Do not shave  48 hours prior to surgery.                 Do not bring valuables to the hospital. [place].  Contacts, dentures or bridgework may not be worn into surgery.  You may bring an small overnight bag       Special Instructions: N/A              Please read over the following fact sheets you were given: _____________________________________________________________________             [facility] - Preparing for Surgery Before surgery, you can play an important role.  Because skin is not sterile, your skin needs to be as free of germs as possible.  You can reduce the number of germs on your skin by washing with CHG (chlorahexidine gluconate) soap before surgery.  CHG is an antiseptic cleaner which kills germs and bonds with the skin to continue killing germs even after washing. Please DO NOT use if you have an allergy to CHG or antibacterial soaps.  If your skin becomes reddened/irritated stop using the CHG and inform your nurse when you arrive at Short Stay. Do not shave (including legs and underarms) for at least 48 hours prior to the first CHG shower.  You may shave your face/neck. Please follow these instructions carefully:  1.  Shower with CHG Soap the night before surgery and the  morning of Surgery.  2.  If you choose to wash your hair, wash your hair first as usual with your  normal  shampoo.  3.  After you shampoo, rinse your hair and body thoroughly to remove the  shampoo.                           4.   Use CHG as you would any other liquid soap.  You can apply chg directly  to the skin and wash                       Gently with a scrungie or clean washcloth.  5.  Apply the CHG Soap to your body ONLY FROM THE NECK DOWN.   Do not use on face/ open                           Wound or open sores. Avoid contact with eyes, ears mouth and genitals (private parts).                       Wash face,  Genitals (private parts) with your normal soap.             6.  Wash thoroughly, paying special attention to the area where your surgery  will be performed.  7.  Thoroughly rinse your body with warm water from the neck down.  8.  DO NOT shower/wash with your normal soap after using  and rinsing off  the CHG Soap.                9.  Pat yourself dry with a clean towel.            10.  Wear clean pajamas.            11.  Place clean sheets on your bed the night of your first shower and do not  sleep with pets. Day of Surgery : Do not apply any lotions/deodorants the morning of surgery.  Please wear clean clothes to the hospital/surgery center.  FAILURE TO FOLLOW THESE INSTRUCTIONS MAY RESULT IN THE CANCELLATION OF YOUR SURGERY PATIENT SIGNATURE_________________________________  NURSE SIGNATURE__________________________________  ________________________________________________________________________

## 2020-04-05 ENCOUNTER — Encounter (HOSPITAL_COMMUNITY)
Admission: RE | Admit: 2020-04-05 | Discharge: 2020-04-05 | Disposition: A | Discharge status: Home / Self Care | Payer: Medicare PPO | Source: Ambulatory Visit | Attending: Surgery | Admitting: Surgery

## 2020-04-05 ENCOUNTER — Other Ambulatory Visit: Payer: Self-pay

## 2020-04-05 ENCOUNTER — Encounter (HOSPITAL_COMMUNITY): Payer: Self-pay

## 2020-04-05 ENCOUNTER — Encounter (HOSPITAL_COMMUNITY): Admission: RE | Admit: 2020-04-05 | Payer: Medicare PPO | Source: Ambulatory Visit

## 2020-04-05 DIAGNOSIS — Z01818 Encounter for other preprocedural examination: Secondary | ICD-10-CM | POA: Insufficient documentation

## 2020-04-05 LAB — BASIC METABOLIC PANEL
Anion gap: 7 (ref 5–15)
BUN: 18 mg/dL (ref 8–23)
CO2: 27 mmol/L (ref 22–32)
Calcium: 8.9 mg/dL (ref 8.9–10.3)
Chloride: 105 mmol/L (ref 98–111)
Creatinine, Ser: 0.89 mg/dL (ref 0.44–1.00)
GFR calc Af Amer: >60 mL/min (ref >60)
GFR calc non Af Amer: >60 mL/min (ref >60)
Glucose, Bld: 100 mg/dL — ABNORMAL HIGH (ref 70–99)
Potassium: 4 mmol/L (ref 3.5–5.1)
Sodium: 139 mmol/L (ref 135–145)

## 2020-04-05 LAB — CBC
HCT: 38.4 % (ref 36.0–46.0)
Hemoglobin: 12.2 g/dL (ref 12.0–15.0)
MCH: 27.4 pg (ref 26.0–34.0)
MCHC: 31.8 g/dL (ref 30.0–36.0)
MCV: 86.1 fL (ref 80.0–100.0)
Platelets: 303 10*3/uL (ref 150–400)
RBC: 4.46 MIL/uL (ref 3.87–5.11)
RDW: 13.6 % (ref 11.5–15.5)
WBC: 7.3 10*3/uL (ref 4.0–10.5)
nRBC: 0 % (ref 0.0–0.2)

## 2020-04-06 NOTE — Progress Notes (Signed)
Anesthesia Chart Review:   Case: [id number] Date/Time: 04/12/20 0845   Procedure: LAPAROSCOPIC VENTRAL WALL HERNIA REPAIR (N/A )   Anesthesia type: General   Pre-op diagnosis: EPIGASTRIC INCISIONAL AND NEW ABDOMINAL WALL HERNIA   Location: [facility] ROOM 01 / [facility]   Surgeons: Michael Boston, MD      DISCUSSION: Pt is a 69 year old with hx ASD (s/p repair 1987), heart murmur ("no murmurs" documented by Dr. Johney Maine in his H&P 02/27/20)   VS: BP 130/66   Pulse 76   Temp 36.7 C (Oral)   Resp 18   Ht 5' 2.5" (1.588 m)   Wt 78.7 kg   SpO2 96%   BMI 31.21 kg/m   PROVIDERS: - PCP is [name], NP   LABS: Labs reviewed: Acceptable for surgery. (all labs ordered are listed, but only abnormal results are displayed)  Labs Reviewed  BASIC METABOLIC PANEL - Abnormal; Notable for the following components:      Result Value   Glucose, Bld 100 (*)    All other components within normal limits  CBC    EKG 04/05/20: NSR. Minimal voltage criteria for LVH, may be normal variant ( R in aVL). Possible Lateral infarct, age undetermined. Appears stable when compared to prior EKG 06/12/18   CV: None available   Past Medical History:  Diagnosis Date  . Chronic interstitial cystitis 2011   Dr Alona Bene with [facility] follows  . Esotropia   . GERD (gastroesophageal reflux disease)   . Heart murmur   . Neuromuscular disorder ([place])   . 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 Doren Custard in 1987    Past Surgical History:  Procedure Laterality Date  . ASD REPAIR  1986  . CARPAL TUNNEL RELEASE Right 2009   Dr. Trenton Gammon  . CERVICAL SPINE SURGERY  2002   diskectomy, Dr. Trenton Gammon  . EYE SURGERY Bilateral 2010, 2015   VI nerve paresis  . STRABISMUS SURGERY Left 10/23/2017   Procedure: STRABISMUS REPAIR LEFT EYE;  Surgeon: Everitt Amber, MD;  Location: [facility];  Service: Ophthalmology;  Laterality: Left;    MEDICATIONS: . acetaminophen (TYLENOL) 500 MG  tablet  . alum & mag hydroxide-simeth (GELUSIL) 200-200-25 MG chewable tablet  . bismuth subsalicylate (PEPTO BISMOL) 262 MG chewable tablet  . Calcium Glycerophosphate (PRELIEF PO)  . Carboxymethylcellulose Sod PF (REFRESH PLUS) 0.5 % SOLN  . cetirizine (ZYRTEC) 10 MG tablet  . Digestive Enzymes (DIGESTIVE ENZYME PO)  . lansoprazole (PREVACID) 15 MG capsule   No current facility-administered medications for this encounter.    If no changes, I anticipate pt can proceed with surgery as scheduled.   Willeen Cass, FNP-BC [facility]/Anesthesiology Phone: [phone] 04/06/2020 2:33 PM

## 2020-04-10 ENCOUNTER — Other Ambulatory Visit (HOSPITAL_COMMUNITY)
Admission: RE | Admit: 2020-04-10 | Discharge: 2020-04-10 | Disposition: A | Discharge status: Home / Self Care | Payer: Medicare PPO | Source: Ambulatory Visit | Attending: Surgery | Admitting: Surgery

## 2020-04-10 ENCOUNTER — Other Ambulatory Visit (HOSPITAL_COMMUNITY): Payer: Medicare PPO

## 2020-04-10 DIAGNOSIS — Z20822 Contact with and (suspected) exposure to covid-19: Secondary | ICD-10-CM | POA: Diagnosis not present

## 2020-04-10 DIAGNOSIS — Z01812 Encounter for preprocedural laboratory examination: Secondary | ICD-10-CM | POA: Diagnosis present

## 2020-04-10 LAB — SARS CORONAVIRUS 2 (TAT 6-24 HRS): SARS Coronavirus 2: NEGATIVE

## 2020-04-11 MED ORDER — CLINDAMYCIN PHOSPHATE 900 MG/50ML IV SOLN
900.0000 mg | INTRAVENOUS | Status: DC
  Filled 2020-04-11: qty 50

## 2020-04-11 MED ORDER — GENTAMICIN SULFATE 40 MG/ML IJ SOLN
320.0000 mg | INTRAVENOUS | Status: AC
  Administered 2020-04-12: 320 mg via INTRAVENOUS
  Filled 2020-04-11: qty 8

## 2020-04-11 MED ORDER — BUPIVACAINE LIPOSOME 1.3 % IJ SUSP
20.0000 mL | INTRAMUSCULAR | Status: DC
  Filled 2020-04-11: qty 20

## 2020-04-12 ENCOUNTER — Encounter (HOSPITAL_COMMUNITY)
Admission: RE | Disposition: A | Discharge status: Home / Self Care | Payer: Self-pay | Source: Home / Self Care | Attending: Surgery

## 2020-04-12 ENCOUNTER — Ambulatory Visit (HOSPITAL_COMMUNITY): Payer: Medicare PPO | Admitting: Anesthesiology

## 2020-04-12 ENCOUNTER — Observation Stay (HOSPITAL_COMMUNITY)
Admission: RE | Admit: 2020-04-12 | Discharge: 2020-04-13 | Disposition: A | Discharge status: Home / Self Care | Payer: Medicare PPO | Attending: Surgery | Admitting: Surgery

## 2020-04-12 ENCOUNTER — Ambulatory Visit (HOSPITAL_COMMUNITY): Payer: Medicare PPO | Admitting: Emergency Medicine

## 2020-04-12 ENCOUNTER — Other Ambulatory Visit: Payer: Self-pay

## 2020-04-12 ENCOUNTER — Encounter (HOSPITAL_COMMUNITY): Payer: Self-pay | Admitting: Surgery

## 2020-04-12 DIAGNOSIS — Z91040 Latex allergy status: Secondary | ICD-10-CM | POA: Diagnosis not present

## 2020-04-12 DIAGNOSIS — Z8719 Personal history of other diseases of the digestive system: Secondary | ICD-10-CM

## 2020-04-12 DIAGNOSIS — K430 Incisional hernia with obstruction, without gangrene: Principal | ICD-10-CM | POA: Insufficient documentation

## 2020-04-12 DIAGNOSIS — Z20822 Contact with and (suspected) exposure to covid-19: Secondary | ICD-10-CM | POA: Diagnosis not present

## 2020-04-12 DIAGNOSIS — R42 Dizziness and giddiness: Secondary | ICD-10-CM

## 2020-04-12 DIAGNOSIS — Z9889 Other specified postprocedural states: Secondary | ICD-10-CM

## 2020-04-12 HISTORY — PX: VENTRAL HERNIA REPAIR: SHX424

## 2020-04-12 SURGERY — REPAIR, HERNIA, VENTRAL, LAPAROSCOPIC
Anesthesia: General | Site: Abdomen

## 2020-04-12 MED ORDER — DIPHENHYDRAMINE HCL 12.5 MG/5ML PO ELIX: 12.5000 mg | ORAL_SOLUTION | Freq: Four times a day (QID) | ORAL | Status: DC | PRN

## 2020-04-12 MED ORDER — MAGIC MOUTHWASH
15.0000 mL | Freq: Four times a day (QID) | ORAL | Status: DC | PRN
  Filled 2020-04-12: qty 15

## 2020-04-12 MED ORDER — METHOCARBAMOL 500 MG PO TABS
750.0000 mg | ORAL_TABLET | Freq: Four times a day (QID) | ORAL | Status: DC | PRN
  Administered 2020-04-12 – 2020-04-13 (×2): 750 mg via ORAL
  Filled 2020-04-12 (×2): qty 2

## 2020-04-12 MED ORDER — MECLIZINE HCL 25 MG PO TABS
12.5000 mg | ORAL_TABLET | Freq: Three times a day (TID) | ORAL | Status: DC | PRN
  Administered 2020-04-12 – 2020-04-13 (×2): 12.5 mg via ORAL
  Filled 2020-04-12 (×2): qty 1

## 2020-04-12 MED ORDER — SCOPOLAMINE 1 MG/3DAYS TD PT72
1.0000 | MEDICATED_PATCH | TRANSDERMAL | Status: DC
  Administered 2020-04-12: 1.5 mg via TRANSDERMAL
  Filled 2020-04-12: qty 1

## 2020-04-12 MED ORDER — ACETAMINOPHEN 500 MG PO TABS
1000.0000 mg | ORAL_TABLET | Freq: Three times a day (TID) | ORAL | Status: DC
  Administered 2020-04-12 – 2020-04-13 (×2): 1000 mg via ORAL
  Filled 2020-04-12 (×2): qty 2

## 2020-04-12 MED ORDER — LORAZEPAM 2 MG/ML IJ SOLN: 0.5000 mg | Freq: Three times a day (TID) | INTRAMUSCULAR | Status: DC | PRN

## 2020-04-12 MED ORDER — CLINDAMYCIN PHOSPHATE 900 MG/50ML IV SOLN
INTRAVENOUS | Status: DC | PRN
  Administered 2020-04-12: 900 mg via INTRAVENOUS

## 2020-04-12 MED ORDER — PROCHLORPERAZINE EDISYLATE 10 MG/2ML IJ SOLN: 5.0000 mg | Freq: Four times a day (QID) | INTRAMUSCULAR | Status: DC | PRN

## 2020-04-12 MED ORDER — MIDAZOLAM HCL 2 MG/2ML IJ SOLN
INTRAMUSCULAR | Status: AC
  Filled 2020-04-12: qty 2

## 2020-04-12 MED ORDER — SODIUM CHLORIDE 0.9 % IV SOLN: 250.0000 mL | INTRAVENOUS | Status: DC | PRN

## 2020-04-12 MED ORDER — EPHEDRINE 5 MG/ML INJ
INTRAVENOUS | Status: AC
  Filled 2020-04-12: qty 10

## 2020-04-12 MED ORDER — GABAPENTIN 300 MG PO CAPS
300.0000 mg | ORAL_CAPSULE | ORAL | Status: AC
  Administered 2020-04-12: 300 mg via ORAL
  Filled 2020-04-12: qty 1

## 2020-04-12 MED ORDER — BUPIVACAINE LIPOSOME 1.3 % IJ SUSP
INTRAMUSCULAR | Status: DC | PRN
  Administered 2020-04-12: 20 mL

## 2020-04-12 MED ORDER — ONDANSETRON HCL 4 MG/2ML IJ SOLN
INTRAMUSCULAR | Status: DC | PRN
  Administered 2020-04-12: 4 mg via INTRAVENOUS

## 2020-04-12 MED ORDER — SODIUM CHLORIDE 0.9% FLUSH: 3.0000 mL | Freq: Two times a day (BID) | INTRAVENOUS | Status: DC

## 2020-04-12 MED ORDER — SUGAMMADEX SODIUM 200 MG/2ML IV SOLN
INTRAVENOUS | Status: DC | PRN
  Administered 2020-04-12: 200 mg via INTRAVENOUS

## 2020-04-12 MED ORDER — GABAPENTIN 300 MG PO CAPS
300.0000 mg | ORAL_CAPSULE | Freq: Two times a day (BID) | ORAL | Status: DC
  Administered 2020-04-12: 300 mg via ORAL
  Filled 2020-04-12: qty 1

## 2020-04-12 MED ORDER — LIDOCAINE HCL 2 % IJ SOLN
INTRAMUSCULAR | Status: AC
  Filled 2020-04-12: qty 20

## 2020-04-12 MED ORDER — PROMETHAZINE HCL 25 MG/ML IJ SOLN
6.2500 mg | INTRAMUSCULAR | Status: DC | PRN
  Administered 2020-04-12: 6.25 mg via INTRAVENOUS

## 2020-04-12 MED ORDER — PROCHLORPERAZINE MALEATE 10 MG PO TABS
10.0000 mg | ORAL_TABLET | Freq: Four times a day (QID) | ORAL | Status: DC | PRN
  Filled 2020-04-12: qty 1

## 2020-04-12 MED ORDER — CHLORHEXIDINE GLUCONATE 0.12 % MT SOLN
15.0000 mL | Freq: Once | OROMUCOSAL | Status: AC
  Administered 2020-04-12: 15 mL via OROMUCOSAL

## 2020-04-12 MED ORDER — PROPOFOL 10 MG/ML IV BOLUS
INTRAVENOUS | Status: AC
  Filled 2020-04-12: qty 20

## 2020-04-12 MED ORDER — MEPERIDINE HCL 50 MG/ML IJ SOLN: 6.2500 mg | INTRAMUSCULAR | Status: DC | PRN

## 2020-04-12 MED ORDER — BUPIVACAINE-EPINEPHRINE (PF) 0.25% -1:200000 IJ SOLN
INTRAMUSCULAR | Status: AC
  Filled 2020-04-12: qty 30

## 2020-04-12 MED ORDER — ZOLPIDEM TARTRATE 5 MG PO TABS: 5.0000 mg | ORAL_TABLET | Freq: Every evening | ORAL | Status: DC | PRN

## 2020-04-12 MED ORDER — LACTATED RINGERS IV SOLN: 1000.0000 mL | Freq: Three times a day (TID) | INTRAVENOUS | Status: DC | PRN

## 2020-04-12 MED ORDER — LACTATED RINGERS IV SOLN: INTRAVENOUS | Status: DC

## 2020-04-12 MED ORDER — ENSURE PRE-SURGERY PO LIQD
296.0000 mL | Freq: Once | ORAL | Status: DC
  Filled 2020-04-12: qty 296

## 2020-04-12 MED ORDER — 0.9 % SODIUM CHLORIDE (POUR BTL) OPTIME
TOPICAL | Status: DC | PRN
  Administered 2020-04-12: 1000 mL

## 2020-04-12 MED ORDER — SIMETHICONE 80 MG PO CHEW: 40.0000 mg | CHEWABLE_TABLET | Freq: Four times a day (QID) | ORAL | Status: DC | PRN

## 2020-04-12 MED ORDER — ENSURE SURGERY PO LIQD
237.0000 mL | Freq: Two times a day (BID) | ORAL | Status: DC
  Filled 2020-04-12 (×2): qty 237

## 2020-04-12 MED ORDER — BUPIVACAINE-EPINEPHRINE 0.25% -1:200000 IJ SOLN
INTRAMUSCULAR | Status: DC | PRN
  Administered 2020-04-12: 60 mL

## 2020-04-12 MED ORDER — TRAMADOL HCL 50 MG PO TABS: 50.0000 mg | ORAL_TABLET | Freq: Four times a day (QID) | ORAL | 0 refills | Status: DC | PRN

## 2020-04-12 MED ORDER — STERILE WATER FOR IRRIGATION IR SOLN
Status: DC | PRN
  Administered 2020-04-12: 1000 mL

## 2020-04-12 MED ORDER — ONDANSETRON HCL 4 MG/2ML IJ SOLN: 4.0000 mg | Freq: Four times a day (QID) | INTRAMUSCULAR | Status: DC | PRN

## 2020-04-12 MED ORDER — ACETAMINOPHEN 10 MG/ML IV SOLN
1000.0000 mg | Freq: Once | INTRAVENOUS | Status: DC | PRN
  Administered 2020-04-12: 1000 mg via INTRAVENOUS

## 2020-04-12 MED ORDER — CHLORHEXIDINE GLUCONATE 0.12 % MT SOLN: 15.0000 mL | Freq: Once | OROMUCOSAL | Status: DC

## 2020-04-12 MED ORDER — LIP MEDEX EX OINT
1.0000 "application " | TOPICAL_OINTMENT | Freq: Two times a day (BID) | CUTANEOUS | Status: DC
  Administered 2020-04-12 – 2020-04-13 (×2): 1 via TOPICAL
  Filled 2020-04-12: qty 7

## 2020-04-12 MED ORDER — CHLORHEXIDINE GLUCONATE CLOTH 2 % EX PADS: 6.0000 | MEDICATED_PAD | Freq: Once | CUTANEOUS | Status: DC

## 2020-04-12 MED ORDER — ACETAMINOPHEN 325 MG PO TABS: 325.0000 mg | ORAL_TABLET | Freq: Once | ORAL | Status: DC | PRN

## 2020-04-12 MED ORDER — MIDAZOLAM HCL 5 MG/5ML IJ SOLN
INTRAMUSCULAR | Status: DC | PRN
  Administered 2020-04-12: 2 mg via INTRAVENOUS

## 2020-04-12 MED ORDER — FENTANYL CITRATE (PF) 100 MCG/2ML IJ SOLN
25.0000 ug | INTRAMUSCULAR | Status: DC | PRN
  Administered 2020-04-12: 50 ug via INTRAVENOUS
  Administered 2020-04-12: 25 ug via INTRAVENOUS
  Administered 2020-04-12: 50 ug via INTRAVENOUS
  Administered 2020-04-12: 25 ug via INTRAVENOUS

## 2020-04-12 MED ORDER — ENSURE PRE-SURGERY PO LIQD
592.0000 mL | Freq: Once | ORAL | Status: DC
  Filled 2020-04-12: qty 592

## 2020-04-12 MED ORDER — HYDROMORPHONE HCL 1 MG/ML IJ SOLN: 0.5000 mg | INTRAMUSCULAR | Status: DC | PRN

## 2020-04-12 MED ORDER — METOPROLOL TARTRATE 5 MG/5ML IV SOLN: 5.0000 mg | Freq: Four times a day (QID) | INTRAVENOUS | Status: DC | PRN

## 2020-04-12 MED ORDER — POLYETHYLENE GLYCOL 3350 17 G PO PACK: 17.0000 g | PACK | Freq: Every day | ORAL | Status: DC | PRN

## 2020-04-12 MED ORDER — PROMETHAZINE HCL 25 MG/ML IJ SOLN
INTRAMUSCULAR | Status: AC
  Filled 2020-04-12: qty 1

## 2020-04-12 MED ORDER — PROPOFOL 10 MG/ML IV BOLUS
INTRAVENOUS | Status: DC | PRN
  Administered 2020-04-12: 130 mg via INTRAVENOUS

## 2020-04-12 MED ORDER — ONDANSETRON 4 MG PO TBDP: 4.0000 mg | ORAL_TABLET | Freq: Four times a day (QID) | ORAL | Status: DC | PRN

## 2020-04-12 MED ORDER — ORAL CARE MOUTH RINSE: 15.0000 mL | Freq: Once | OROMUCOSAL | Status: AC

## 2020-04-12 MED ORDER — ORAL CARE MOUTH RINSE: 15.0000 mL | Freq: Once | OROMUCOSAL | Status: DC

## 2020-04-12 MED ORDER — FENTANYL CITRATE (PF) 250 MCG/5ML IJ SOLN
INTRAMUSCULAR | Status: DC | PRN
  Administered 2020-04-12: 100 ug via INTRAVENOUS
  Administered 2020-04-12 (×3): 50 ug via INTRAVENOUS

## 2020-04-12 MED ORDER — ACETAMINOPHEN 500 MG PO TABS: 1000.0000 mg | ORAL_TABLET | ORAL | Status: AC

## 2020-04-12 MED ORDER — ROCURONIUM BROMIDE 100 MG/10ML IV SOLN
INTRAVENOUS | Status: DC | PRN
  Administered 2020-04-12: 50 mg via INTRAVENOUS
  Administered 2020-04-12: 10 mg via INTRAVENOUS
  Administered 2020-04-12: 20 mg via INTRAVENOUS

## 2020-04-12 MED ORDER — LIDOCAINE 20MG/ML (2%) 15 ML SYRINGE OPTIME
INTRAMUSCULAR | Status: DC | PRN
  Administered 2020-04-12: 1.5 mg/kg/h via INTRAVENOUS

## 2020-04-12 MED ORDER — ENOXAPARIN SODIUM 40 MG/0.4ML ~~LOC~~ SOLN: 40.0000 mg | SUBCUTANEOUS | Status: DC

## 2020-04-12 MED ORDER — SODIUM CHLORIDE 0.9 % IV SOLN: INTRAVENOUS | Status: DC

## 2020-04-12 MED ORDER — ACETAMINOPHEN 160 MG/5ML PO SOLN: 325.0000 mg | Freq: Once | ORAL | Status: DC | PRN

## 2020-04-12 MED ORDER — FENTANYL CITRATE (PF) 100 MCG/2ML IJ SOLN
INTRAMUSCULAR | Status: AC
  Filled 2020-04-12: qty 2

## 2020-04-12 MED ORDER — FENTANYL CITRATE (PF) 250 MCG/5ML IJ SOLN
INTRAMUSCULAR | Status: AC
  Filled 2020-04-12: qty 5

## 2020-04-12 MED ORDER — EPHEDRINE SULFATE-NACL 50-0.9 MG/10ML-% IV SOSY
PREFILLED_SYRINGE | INTRAVENOUS | Status: DC | PRN
  Administered 2020-04-12: 10 mg via INTRAVENOUS

## 2020-04-12 MED ORDER — BUPIVACAINE-EPINEPHRINE 0.25% -1:200000 IJ SOLN
INTRAMUSCULAR | Status: AC
  Filled 2020-04-12: qty 1

## 2020-04-12 MED ORDER — ACETAMINOPHEN 500 MG PO TABS
ORAL_TABLET | ORAL | Status: AC
  Administered 2020-04-12: 1000 mg via ORAL
  Filled 2020-04-12: qty 2

## 2020-04-12 MED ORDER — OXYCODONE HCL 5 MG PO TABS: 5.0000 mg | ORAL_TABLET | ORAL | Status: DC | PRN

## 2020-04-12 MED ORDER — DIPHENHYDRAMINE HCL 50 MG/ML IJ SOLN
12.5000 mg | Freq: Four times a day (QID) | INTRAMUSCULAR | Status: DC | PRN
  Administered 2020-04-12: 12.5 mg via INTRAVENOUS
  Filled 2020-04-12: qty 1

## 2020-04-12 MED ORDER — BISACODYL 10 MG RE SUPP: 10.0000 mg | Freq: Every day | RECTAL | Status: DC | PRN

## 2020-04-12 MED ORDER — LACTATED RINGERS IV BOLUS: 1000.0000 mL | Freq: Three times a day (TID) | INTRAVENOUS | Status: DC | PRN

## 2020-04-12 MED ORDER — ACETAMINOPHEN 10 MG/ML IV SOLN
INTRAVENOUS | Status: AC
  Filled 2020-04-12: qty 100

## 2020-04-12 MED ORDER — DEXAMETHASONE SODIUM PHOSPHATE 10 MG/ML IJ SOLN
INTRAMUSCULAR | Status: DC | PRN
Start: 2020-04-12 — End: 2020-04-12
  Administered 2020-04-12: 10 mg via INTRAVENOUS

## 2020-04-12 MED ORDER — LIDOCAINE HCL (CARDIAC) PF 100 MG/5ML IV SOSY
PREFILLED_SYRINGE | INTRAVENOUS | Status: DC | PRN
  Administered 2020-04-12: 50 mg via INTRAVENOUS

## 2020-04-12 MED ORDER — SODIUM CHLORIDE 0.9% FLUSH: 3.0000 mL | INTRAVENOUS | Status: DC | PRN

## 2020-04-12 MED ORDER — ONDANSETRON HCL 4 MG/2ML IJ SOLN
INTRAMUSCULAR | Status: AC
  Filled 2020-04-12: qty 2

## 2020-04-12 SURGICAL SUPPLY — 51 items
APL PRP STRL LF DISP 70% ISPRP (MISCELLANEOUS) ×1
APL SWBSTK 6 STRL LF DISP (MISCELLANEOUS) ×1
APPLICATOR COTTON TIP 6 STRL (MISCELLANEOUS) IMPLANT
APPLICATOR COTTON TIP 6IN STRL (MISCELLANEOUS) ×3
APPLIER CLIP 5 13 M/L LIGAMAX5 (MISCELLANEOUS)
APR CLP MED LRG 5 ANG JAW (MISCELLANEOUS)
BINDER ABDOMINAL 12 ML 46-62 (SOFTGOODS) ×2 IMPLANT
CABLE HIGH FREQUENCY MONO STRZ (ELECTRODE) ×3 IMPLANT
CHLORAPREP W/TINT 26 (MISCELLANEOUS) ×3 IMPLANT
CLIP APPLIE 5 13 M/L LIGAMAX5 (MISCELLANEOUS) IMPLANT
CLOSURE STERI-STRIP 1/2X4 (GAUZE/BANDAGES/DRESSINGS) ×2
CLOSURE WOUND 1/2 X4 (GAUZE/BANDAGES/DRESSINGS)
CLSR STERI-STRIP ANTIMIC 1/2X4 (GAUZE/BANDAGES/DRESSINGS) ×2 IMPLANT
COVER SURGICAL LIGHT HANDLE (MISCELLANEOUS) ×3 IMPLANT
COVER WAND RF STERILE (DRAPES) ×3 IMPLANT
DECANTER SPIKE VIAL GLASS SM (MISCELLANEOUS) ×5 IMPLANT
DEVICE SECURE STRAP 25 ABSORB (INSTRUMENTS) ×2 IMPLANT
DEVICE TROCAR PUNCTURE CLOSURE (ENDOMECHANICALS) ×3 IMPLANT
DRAPE WARM FLUID 44X44 (DRAPES) ×3 IMPLANT
DRSG TEGADERM 2-3/8X2-3/4 SM (GAUZE/BANDAGES/DRESSINGS) ×9 IMPLANT
DRSG TEGADERM 4X4.75 (GAUZE/BANDAGES/DRESSINGS) ×3 IMPLANT
ELECT REM PT RETURN 15FT ADLT (MISCELLANEOUS) ×3 IMPLANT
GAUZE SPONGE 2X2 8PLY STRL LF (GAUZE/BANDAGES/DRESSINGS) IMPLANT
GLOVE ECLIPSE 8.0 STRL XLNG CF (GLOVE) ×3 IMPLANT
GLOVE INDICATOR 8.0 STRL GRN (GLOVE) ×3 IMPLANT
GOWN STRL REUS W/TWL XL LVL3 (GOWN DISPOSABLE) ×6 IMPLANT
IRRIG SUCT STRYKERFLOW 2 WTIP (MISCELLANEOUS)
IRRIGATION SUCT STRKRFLW 2 WTP (MISCELLANEOUS) IMPLANT
KIT BASIN (CUSTOM PROCEDURE TRAY) ×3 IMPLANT
KIT TURNOVER KIT A (KITS) IMPLANT
MARKER SKIN DUAL TIP RULER LAB (MISCELLANEOUS) ×3 IMPLANT
MESH VENTRALIGHT ST 8X10 (Mesh General) ×2 IMPLANT
NDL SPNL 22GX3.5 QUINCKE BK (NEEDLE) IMPLANT
NEEDLE HYPO 22GX1.5 SAFETY (NEEDLE) ×2 IMPLANT
NEEDLE SPNL 22GX3.5 QUINCKE BK (NEEDLE) ×3 IMPLANT
PAD POSITIONING PINK XL (MISCELLANEOUS) ×3 IMPLANT
PENCIL SMOKE EVACUATOR (MISCELLANEOUS) IMPLANT
SCISSORS LAP 5X35 DISP (ENDOMECHANICALS) ×3 IMPLANT
SET TUBE SMOKE EVAC HIGH FLOW (TUBING) ×3 IMPLANT
SLEEVE ADV FIXATION 5X100MM (TROCAR) ×3 IMPLANT
SPONGE GAUZE 2X2 STER 10/PKG (GAUZE/BANDAGES/DRESSINGS) ×2
STRIP CLOSURE SKIN 1/2X4 (GAUZE/BANDAGES/DRESSINGS) ×2 IMPLANT
SUT MNCRL AB 4-0 PS2 18 (SUTURE) ×5 IMPLANT
SUT PDS AB 1 CT1 27 (SUTURE) ×8 IMPLANT
SUT PROLENE 1 CT 1 30 (SUTURE) ×17 IMPLANT
SUT VIC AB 2-0 SH 18 (SUTURE) ×2 IMPLANT
TOWEL OR 17X26 10 PK STRL BLUE (TOWEL DISPOSABLE) ×3 IMPLANT
TRAY LAPAROSCOPIC (CUSTOM PROCEDURE TRAY) ×2 IMPLANT
TROCAR ADV FIXATION 11X100MM (TROCAR) IMPLANT
TROCAR ADV FIXATION 5X100MM (TROCAR) ×3 IMPLANT
TROCAR BLADELESS OPT 5 100 (ENDOMECHANICALS) ×3 IMPLANT

## 2020-04-12 NOTE — Progress Notes (Signed)
Patient still with some dizziness upon standing, medicated per [facility] with meclizine and scopolamine patch, able to ambulate into the bathroom x2 and then used the [facility] due to dizziness and fear of falling,4 port sites (gauze/tegaderm) intact, RUQ site with serosanguineous drainage, 12 puncture sites present with steristrips across, abd softly distended and binder in place, ice offered but declined at this time, +bsx4, pt tolerating full liquids- will advance to regular for morning, no N/V, voiding QS, VSS, using call bell appropriately, will continue to monitor.

## 2020-04-12 NOTE — Progress Notes (Signed)
Spoke with Drs Johney Maine and Smith Robert regarding Pt's dizziness and inability to focus eyesight , unable to ambulate to bathroom.  Using bedpan in chair.  Otherwise, pain and nausea relieved with tylenol and phenergan.  Floor bed acquired, Pt transferred via wheelchair and bedside report handoff given to RN.

## 2020-04-12 NOTE — Transfer of Care (Signed)
Immediate Anesthesia Transfer of Care Note  Patient: Deborah Archer  Procedure(s) Performed: LAPAROSCOPIC VENTRAL WALL HERNIA REPAIR WITH MESH (N/A Abdomen)  Patient Location: PACU  Anesthesia Type:General  Level of Consciousness: drowsy, patient cooperative and responds to stimulation  Airway & Oxygen Therapy: Patient Spontanous Breathing and Patient connected to face mask oxygen  Post-op Assessment: Report given to RN and Post -op Vital signs reviewed and stable  Post vital signs: Reviewed and stable  Last Vitals:  Vitals Value Taken Time  BP 158/98 04/12/20 1131  Temp    Pulse 76 04/12/20 1133  Resp 18 04/12/20 1133  SpO2 100 % 04/12/20 1133  Vitals shown include unvalidated device data.  Last Pain:  Vitals:   04/12/20 0746  TempSrc: Oral         Complications: No complications documented.

## 2020-04-12 NOTE — Op Note (Signed)
04/12/2020  PATIENT:  Deborah Archer  69 y.o. female  Patient Care Team: [name], NP as PCP - General (Nurse Practitioner) Domingo Pulse, MD as Consulting Physician (Urology) Michael Boston, MD as Consulting Physician (General Surgery) Everitt Amber, MD as Consulting Physician (Ophthalmology)  PRE-OPERATIVE DIAGNOSIS:  EPIGASTRIC INCISIONAL AND NEW ABDOMINAL WALL HERNIA  POST-OPERATIVE DIAGNOSIS:  INCARCERATED INCISIONAL HERNIA  PROCEDURE:   LAPAROSCOPIC REPAIR OF INCARCERATED INCISIONAL HERNIA WITH MESH TAP BLOCK - BILATERAL  SURGEON:  [name], MD  ASSISTANT: Nurse   ANESTHESIA:     General  Nerve block provided with liposomal bupivacaine (Experel) mixed with 0.25% bupivacaine as a Bilateral TAP block x 69mL each side at the level of the transverse abdominis & preperitoneal spaces along the flank at the anterior axillary line, from subcostal ridge to iliac crest under laparoscopic guidance   EBL:  Total I/O In: 1158 [I.V.:1000; IV Piggyback:158] Out: 20 [Blood:20]  Per anesthesia record  Delay start of Pharmacological VTE agent (>24hrs) due to surgical blood loss or risk of bleeding:  no  DRAINS: none   SPECIMEN:  No Specimen  DISPOSITION OF SPECIMEN:  N/A  COUNTS:  YES  PLAN OF CARE: Discharge to home after PACU  PATIENT DISPOSITION:  PACU - hemodynamically stable.  INDICATION: Pleasant patient has developed a ventral wall abdominal hernia.   Recommendation was made for surgical repair:  The anatomy & physiology of the abdominal wall was discussed. The pathophysiology of hernias was discussed. Natural history risks without surgery including progeressive enlargement, pain, incarceration & strangulation was discussed. Contributors to complications such as smoking, obesity, diabetes, prior surgery, etc were discussed.  I feel the risks of no intervention will lead to serious problems that outweigh the operative risks; therefore, I recommended surgery to  reduce and repair the hernia. I explained laparoscopic techniques with possible need for an open approach. I noted the probable use of mesh to patch and/or buttress the hernia repair  Risks such as bleeding, infection, abscess, need for further treatment, heart attack, death, and other risks were discussed. I noted a good likelihood this will help address the problem. Goals of post-operative recovery were discussed as well. Possibility that this will not correct all symptoms was explained. I stressed the importance of low-impact activity, aggressive pain control, avoiding constipation, & not pushing through pain to minimize risk of post-operative chronic pain or injury. Possibility of reherniation especially with smoking, obesity, diabetes, immunosuppression, and other health conditions was discussed. We will work to minimize complications.  An educational handout further explaining the pathology & treatment options was given as well. Questions were answered. The patient expresses understanding & wishes to proceed with surgery.   OR FINDINGS: 5 x 5 cm epigastric incisional hernia just below xiphoid incarcerated with moderate volume of omentum and falciform ligament.  Type of repair: Laparoscopic underlay repair   Placement of mesh: Centrally intraperitoneal with edges tucked into preperitoneal space  Name of mesh: Bard Ventralight dual sided (polypropylene / Seprafilm)  Size of mesh: 25x20cm  Orientation: Vertical  Mesh overlap:  5-7cm   DESCRIPTION:   Informed consent was confirmed. The patient underwent general anaesthesia without difficulty. The patient was positioned appropriately. VTE prevention in place. The patient's abdomen was clipped, prepped, & draped in a sterile fashion. Surgical timeout confirmed our plan.  The patient was positioned in reverse Trendelenburg. Abdominal entry was gained using optical entry technique in the left upper abdomen. Entry was clean. I induced carbon  dioxide insufflation. Camera inspection  revealed no injury. Extra ports were carefully placed under direct laparoscopic visualization.   I could see adhesions on the parietal peritoneum under the abdominal wall.  I freed off the falciform ligament and central peritoneum to expose the retrorectus fascia to help come around the hernia sac.  We then I was gradually able to mobilize and reduce the hernia sac and its contents down.  Mild transverse colon and stomach seen nearby, there were no definitely incarcerated.  Seen primarily omentum.   I made sure hemostasis was good.  I mapped out the region using a needle passer.   To ensure that I would have at least 5 cm radial coverage outside of the hernia defect, I chose a 25x20cm dual sided mesh.  I placed #1 Prolene stitches around its lower edge & sides about every 5 cm = 8 total.  I rolled the mesh & placed into the peritoneal cavity through the hernia defect.  I unrolled the mesh and positioned it appropriately, making sure that the upper third was above the costal margin and xiphoid..  I secured the mesh to cover up the hernia defect using a laparoscopic suture passer to pass the tails of the Prolene through the abdominal wall & tagged them with clamps for good transfascial suturing.  I started out in corners to make sure I had the mesh centered under the hernia defect appropriately, and then proceeded to work in quadrants.  I also used a laparoscopic suture passer along the paramedian right and left subcostal ridges in and out the mesh to help tack the upper central part of the mesh to the upper costal margin.  We evacuated CO2 & desufflated the abdomen.  I tied the fascial stitches down. I closed the fascial defect that I placed the mesh through using #1 PDS interrupted transverse stitches primarily.  I reinsufflated the abdomen. The mesh provided at least circumferential coverage around the entire region of hernia defects.   I secured the mesh centrally with  an additional trans fascial stitch in & out the mesh using #1 PDS under laparoscopic visualization.  Given her obesity and atypical location, the use #1 Prolene interrupted sutures in the right & left upper quadrant to help tack the center part of the mesh to the anterior abdominal wall with good fascial sutures.  I included peritoneum as well to help me tack the peritoneum and falciform ligament back up to the anterior abdominal wall and help talk a fair amount of the mesh to be preperitoneal I tacked the edges & central part of the mesh to the peritoneum/posterior rectus fascia with SecureStrap absorbable tacks.   I did reinspection. Hemostasis was good. Mesh laid well. I completed a broad field block of local anesthesia at fascial stitch sites & fascial closure areas.    Capnoperitoneum was evacuated. Ports were removed. The skin was closed with Monocryl at the port sites and Steri-Strips on the fascial stitch puncture sites.  Patient is being extubated to go to the recovery room.  I discussed operative findings, updated the patient's status, discussed probable steps to recovery, and gave postoperative recommendations to the patient's friend, Hall Busing.  Recommendations were made.  Questions were answered.  She expressed understanding & appreciation.  [name], M.D., F.A.C.S. Gastrointestinal and Minimally Invasive Surgery [facility] [address] [place], [place] [address] [phone] Main / Paging  04/12/2020 11:44 AM

## 2020-04-12 NOTE — Discharge Instructions (Signed)
HERNIA REPAIR: POST OP INSTRUCTIONS ° °###################################################################### ° °EAT °Gradually transition to a high fiber diet with a fiber supplement over the next few weeks after discharge.  Start with a pureed / full liquid diet (see below) ° °WALK °Walk an hour a day.  Control your pain to do that.   ° °CONTROL PAIN °Control pain so that you can walk, sleep, tolerate sneezing/coughing, and go up/down stairs. ° °HAVE A BOWEL MOVEMENT DAILY °Keep your bowels regular to avoid problems.  OK to try a laxative to override constipation.  OK to use an antidairrheal to slow down diarrhea.  Call if not better after 2 tries ° °CALL IF YOU HAVE PROBLEMS/CONCERNS °Call if you are still struggling despite following these instructions. °Call if you have concerns not answered by these instructions ° °###################################################################### ° ° ° °1. DIET: Follow a light bland diet & liquids the first 24 hours after arrival home, such as soup, liquids, starches, etc.  Be sure to drink plenty of fluids.  Quickly advance to a usual solid diet within a few days.  Avoid fast food or heavy meals as your are more likely to get nauseated or have irregular bowels.  A low-fat, high-fiber diet for the rest of your life is ideal.  ° °2. Take your usually prescribed home medications unless otherwise directed. ° °3. PAIN CONTROL: °a. Pain is best controlled by a usual combination of three different methods TOGETHER: °i. Ice/Heat °ii. Over the counter pain medication °iii. Prescription pain medication °b. Most patients will experience some swelling and bruising around the hernia(s) such as the bellybutton, groins, or old incisions.  Ice packs or heating pads (30-60 minutes up to 6 times a day) will help. Use ice for the first few days to help decrease swelling and bruising, then switch to heat to help relax tight/sore spots and speed recovery.  Some people prefer to use ice  alone, heat alone, alternating between ice & heat.  Experiment to what works for you.  Swelling and bruising can take several weeks to resolve.   °c. It is helpful to take an over-the-counter pain medication regularly for the first few weeks.  Choose one of the following that works best for you: °i. Naproxen (Aleve, etc)  Two 220mg tabs twice a day °ii. Ibuprofen (Advil, etc) Three 200mg tabs four times a day (every meal & bedtime) °iii. Acetaminophen (Tylenol, etc) 325-650mg four times a day (every meal & bedtime) °d. A  prescription for pain medication should be given to you upon discharge.  Take your pain medication as prescribed.  °i. If you are having problems/concerns with the prescription medicine (does not control pain, nausea, vomiting, rash, itching, etc), please call us [phone] to see if we need to switch you to a different pain medicine that will work better for you and/or control your side effect better. °ii. If you need a refill on your pain medication, please contact your pharmacy.  They will contact our office to request authorization. Prescriptions will not be filled after 5 pm or on week-ends. ° °4. Avoid getting constipated.  Between the surgery and the pain medications, it is common to experience some constipation.  Increasing fluid intake and taking a fiber supplement (such as Metamucil, Citrucel, FiberCon, MiraLax, etc) 1-2 times a day regularly will usually help prevent this problem from occurring.  A mild laxative (prune juice, Milk of Magnesia, MiraLax, etc) should be taken according to package directions if there are no bowel movements after 48   hours.    5. Wash / shower every day.  You may shower over the dressings as they are waterproof.    6. Remove your waterproof bandages, skin tapes, and other bandages 5 days after surgery. You may replace a dressing/Band-Aid to cover the incision for comfort if you wish. You may leave the incisions open to air.  You may replace a  dressing/Band-Aid to cover an incision for comfort if you wish.  Continue to shower over incision(s) after the dressing is off.  7. ACTIVITIES as tolerated:   a. You may resume regular (light) daily activities beginning the next day--such as daily self-care, walking, climbing stairs--gradually increasing activities as tolerated.  Control your pain so that you can walk an hour a day.  If you can walk 30 minutes without difficulty, it is safe to try more intense activity such as jogging, treadmill, bicycling, low-impact aerobics, swimming, etc. b. Save the most intensive and strenuous activity for last such as sit-ups, heavy lifting, contact sports, etc  Refrain from any heavy lifting or straining until you are off narcotics for pain control.   c. DO NOT PUSH THROUGH PAIN.  Let pain be your guide: If it hurts to do something, don't do it.  Pain is your body warning you to avoid that activity for another week until the pain goes down. d. You may drive when you are no longer taking prescription pain medication, you can comfortably wear a seatbelt, and you can safely maneuver your car and apply brakes. e. Dennis Bast may have sexual intercourse when it is comfortable.   8. FOLLOW UP in our office a. Please call [facility] at [phone] to set up an appointment to see your surgeon in the office for a follow-up appointment approximately 2-3 weeks after your surgery. b. Make sure that you call for this appointment the day you arrive home to insure a convenient appointment time.  9.  If you have disability of FMLA / Family leave forms, please bring the forms to the office for processing.  (do not give to your surgeon).  WHEN TO CALL us [phone]: 1. Poor pain control 2. Reactions / problems with new medications (rash/itching, nausea, etc)  3. Fever over 101.5 F (38.5 C) 4. Inability to urinate 5. Nausea and/or vomiting 6. Worsening swelling or bruising 7. Continued bleeding from incision. 8. Increased pain,  redness, or drainage from the incision   The clinic staff is available to answer your questions during regular business hours (8:30am-5pm).  Please dont hesitate to call and ask to speak to one of our nurses for clinical concerns.   If you have a medical emergency, go to the nearest emergency room or call 911.  A surgeon from [facility] is always on call at the hospitals in [facility], [place], [place], [place], [place]  [address] ?  [address] [address], [place], [place]   [address] MAIN: [phone] ? TOLL FREE: [phone] ? FAX: [phone] [email or web address]   General Anesthesia, Adult, Care After This sheet gives you information about how to care for yourself after your procedure. Your health care provider may also give you more specific instructions. If you have problems or questions, contact your health care provider. What can I expect after the procedure? After the procedure, the following side effects are common:  Pain or discomfort at the IV site.  Nausea.  Vomiting.  Sore throat.  Trouble concentrating.  Feeling cold or chills.  Weak or tired.  Sleepiness and fatigue.  Soreness and body aches. These side effects can affect parts of the body that were not involved in surgery. Follow these instructions at home:  For at least 24 hours after the procedure:  Have a responsible adult stay with you. It is important to have someone help care for you until you are awake and alert.  Rest as needed.  Do not: ? Participate in activities in which you could fall or become injured. ? Drive. ? Use heavy machinery. ? Drink alcohol. ? Take sleeping pills or medicines that cause drowsiness. ? Make important decisions or sign legal documents. ? Take care of children on your own. Eating and drinking  Follow any instructions from your health care provider about eating or drinking restrictions.  When you  feel hungry, start by eating small amounts of foods that are soft and easy to digest (bland), such as toast. Gradually return to your regular diet.  Drink enough fluid to keep your urine pale yellow.  If you vomit, rehydrate by drinking water, juice, or clear broth. General instructions  If you have sleep apnea, surgery and certain medicines can increase your risk for breathing problems. Follow instructions from your health care provider about wearing your sleep device: ? Anytime you are sleeping, including during daytime naps. ? While taking prescription pain medicines, sleeping medicines, or medicines that make you drowsy.  Return to your normal activities as told by your health care provider. Ask your health care provider what activities are safe for you.  Take over-the-counter and prescription medicines only as told by your health care provider.  If you smoke, do not smoke without supervision.  Keep all follow-up visits as told by your health care provider. This is important. Contact a health care provider if:  You have nausea or vomiting that does not get better with medicine.  You cannot eat or drink without vomiting.  You have pain that does not get better with medicine.  You are unable to pass urine.  You develop a skin rash.  You have a fever.  You have redness around your IV site that gets worse. Get help right away if:  You have difficulty breathing.  You have chest pain.  You have blood in your urine or stool, or you vomit blood. Summary  After the procedure, it is common to have a sore throat or nausea. It is also common to feel tired.  Have a responsible adult stay with you for the first 24 hours after general anesthesia. It is important to have someone help care for you until you are awake and alert.  When you feel hungry, start by eating small amounts of foods that are soft and easy to digest (bland), such as toast. Gradually return to your regular  diet.  Drink enough fluid to keep your urine pale yellow.  Return to your normal activities as told by your health care provider. Ask your health care provider what activities are safe for you. This information is not intended to replace advice given to you by your health care provider. Make sure you discuss any questions you have with your health care provider. Document Revised: 09/25/2017 Document Reviewed: 05/08/2017 Elsevier Patient Education  2020 [employer]   

## 2020-04-12 NOTE — Anesthesia Preprocedure Evaluation (Addendum)
Anesthesia Evaluation  Patient identified by MRN, date of birth, ID band Patient awake    Reviewed: Allergy & Precautions, NPO status , Patient's Chart, lab work & pertinent test results  Airway Mallampati: III  TM Distance: >3 FB Neck ROM: Full  Mouth opening: Limited Mouth Opening  Dental  (+) Teeth Intact, Dental Advisory Given   Pulmonary    breath sounds clear to auscultation       Cardiovascular  Rhythm:Regular Rate:Normal     Neuro/Psych Anxiety    GI/Hepatic Neg liver ROS, GERD  ,  Endo/Other  negative endocrine ROS  Renal/GU negative Renal ROS     Musculoskeletal  (+) Arthritis ,   Abdominal Normal abdominal exam  (+)   Peds  Hematology negative hematology ROS (+)   Anesthesia Other Findings   Reproductive/Obstetrics                             Anesthesia Physical Anesthesia Plan  ASA: II  Anesthesia Plan: General   Post-op Pain Management:    Induction: Intravenous  PONV Risk Score and Plan: 4 or greater and Ondansetron, Dexamethasone, Treatment may vary due to age or medical condition and Midazolam  Airway Management Planned: Oral ETT and Video Laryngoscope Planned  Additional Equipment: None  Intra-op Plan:   Post-operative Plan: Extubation in OR  Informed Consent: I have reviewed the patients History and Physical, chart, labs and discussed the procedure including the risks, benefits and alternatives for the proposed anesthesia with the patient or authorized representative who has indicated his/her understanding and acceptance.     Dental advisory given  Plan Discussed with: CRNA  Anesthesia Plan Comments:         Anesthesia Quick Evaluation

## 2020-04-12 NOTE — Anesthesia Procedure Notes (Signed)
Procedure Name: Intubation Date/Time: 04/12/2020 9:09 AM Performed by: Glory Buff, CRNA Pre-anesthesia Checklist: Patient identified, Emergency Drugs available, Suction available and Patient being monitored Patient Re-evaluated:Patient Re-evaluated prior to induction Oxygen Delivery Method: Circle system utilized Preoxygenation: Pre-oxygenation with 100% oxygen Induction Type: IV induction Ventilation: Mask ventilation without difficulty Laryngoscope Size: Glidescope and 3 Grade View: Grade I Tube type: Oral Tube size: 7.0 mm Number of attempts: 1 Airway Equipment and Method: Stylet and Oral airway Placement Confirmation: ETT inserted through vocal cords under direct vision,  positive ETCO2 and breath sounds checked- equal and bilateral Secured at: 20 cm Tube secured with: Tape Dental Injury: Teeth and Oropharynx as per pre-operative assessment  Difficulty Due To: Difficulty was anticipated and Difficult Airway- due to limited oral opening Future Recommendations: Recommend- induction with short-acting agent, and alternative techniques readily available

## 2020-04-12 NOTE — Anesthesia Postprocedure Evaluation (Addendum)
Anesthesia Post Note  Patient: Deborah Archer  Procedure(s) Performed: LAPAROSCOPIC VENTRAL WALL HERNIA REPAIR WITH MESH (N/A Abdomen)     Patient location during evaluation: PACU Anesthesia Type: General Level of consciousness: awake and alert Pain management: pain level controlled Vital Signs Assessment: post-procedure vital signs reviewed and stable Respiratory status: spontaneous breathing, nonlabored ventilation, respiratory function stable and patient connected to nasal cannula oxygen Cardiovascular status: blood pressure returned to baseline and stable Postop Assessment: no apparent nausea or vomiting Anesthetic complications: no Comments: Occasional episodes of dizziness in PACU. Recommended overnight obs to surgeon.     No complications documented.  Last Vitals:  Vitals:   04/12/20 1245 04/12/20 1300  BP: (!) 149/83 (!) 149/77  Pulse: 65 66  Resp: 11 15  Temp:  (!) 36.3 C  SpO2: 96% 100%    Last Pain:  Vitals:   04/12/20 1215  TempSrc:   PainSc: [name]

## 2020-04-12 NOTE — Plan of Care (Signed)

## 2020-04-12 NOTE — H&P (Signed)
Deborah Archer DOB: 03/22/1951 Married / Language: [name] / Race: White Female  Patient Care Team: [name], NP as PCP - General (Nurse Practitioner) Domingo Pulse, MD as Consulting Physician (Urology) Michael Boston, MD as Consulting Physician (General Surgery) Everitt Amber, MD as Consulting Physician (Ophthalmology)   Marland Kitchen ` Patient sent for surgical consultation at the request of Dr Leilani Merl [facility]  Chief Complaint: Enlarging mass in upper abdomen. Concern for worsening hernia. ` ` Patient returns. She held off on any hernia surgery with COVID pandemic. Also her daughter had some health concerns that took many months last year to try and help sort out. She feels like there hernias condyloma larger. She's been nervous about proceeding with surgery but also concern that has gotten worse. Comes back in to rediscuss considering hernia repair  PRIOR NOTE 2020: The patient is a pleasant chatty woman that has had numerous health issues. 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 She noticed a pop and discomfort in 2003 upper part of her abdomen. The lowest part of her incision. She thinks it is related to physical therapy. She is felt a lump there ever since. She recalls seeing Dr. Rise Patience with our group in the past about this. Not particularly larger bothersome, recommendation for surgery was held off. However it is gone progressively larger. It is now obviously noticeable. It is more sensitive. Can bother her when she stretching or bending. Bumps into it. Because it is gradually gotten larger, she wish to reconsider surgical repair. Consultation requested.  She does not smoke. She is not diabetic. She's not had any cardiac issues since her septal defect repair. Not on blood thinners. No dysrhythmias. He can walk 20-30 minutes a day without difficulty. 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 She is quite certain that I took out her daughters gallbladder on 2011. He have no record of this yet. We are looking to just in case. Patient notes she can have a sensitive stomach. Usually moves her bowels most days. No history of infections. No other abdominal surgeries.  (Review of systems as stated in this history (HPI) or in the review of systems. Otherwise all other 12 point ROS are negative) ` ` `   Problem List/Past Medical [name], MD; 02/27/2020 3:11 PM) Nira Conn INCISIONAL HERNIA (K43.0) PREOP - [place] - ENCOUNTER FOR PREOPERATIVE EXAMINATION FOR GENERAL SURGICAL PROCEDURE (J62.831)  Past Surgical History [name], MD; 02/27/2020 3:11 PM) Colon Polyp Removal - Colonoscopy Oral Surgery Spinal Surgery - Neck  Diagnostic Studies History [name], MD; 02/27/2020 3:11 PM) Colonoscopy 5-10 years ago Mammogram within last year Pap Smear >5 years ago  Allergies (Chanel Teressa Senter, CMA; 02/27/2020 2:48 PM) NSAIDs Penicillins Protonix *ULCER DRUGS/ANTISPASMODICS/ANTICHOLINERGICS* Allergies Reconciled  Medication History (Chanel Teressa Senter, CMA; 02/27/2020 2:48 PM) Pravastatin Sodium (20MG  Tablet, Oral) Active. Medications Reconciled  Social History [name], MD; 02/27/2020 3:11 PM) Caffeine use Carbonated beverages, Tea. No alcohol use No drug use Tobacco use Never smoker.  Family History [name], MD; 02/27/2020 3:11 PM) Family history unknown First Degree Relatives  Pregnancy / Birth History [name], MD; 02/27/2020 3:11 PM) Age at menarche 69 years. Age of menopause 50-55 Contraceptive History Oral contraceptives. Gravida 1 Length (months) of breastfeeding 3-6 Maternal age 46-35 Para 1  Other Problems [name], MD; 02/27/2020 3:11 PM) Anxiety Disorder Back Pain Gastroesophageal Reflux Disease  Heart  murmur Hemorrhoids Hypercholesterolemia Ventral Hernia Repair     Review of Systems [name], MD; 02/27/2020 3:11 PM) General Not Present- Appetite Loss, Chills, Fatigue, Fever, Night Sweats, Weight Gain and Weight Loss. Skin Not Present- Change in Wart/Mole, Dryness, Hives, Jaundice, New Lesions, Non-Healing Wounds, Rash and Ulcer. HEENT Present- Wears glasses/contact lenses. Not Present- Earache, Hearing Loss, Hoarseness, Nose Bleed, Oral Ulcers, Ringing in the Ears, Seasonal Allergies, Sinus Pain, Sore Throat, Visual Disturbances and Yellow Eyes. Respiratory Not Present- Bloody sputum, Chronic Cough, Difficulty Breathing, Snoring and Wheezing. Breast Not Present- Breast Mass, Breast Pain, Nipple Discharge and Skin Changes. Cardiovascular Not Present- Chest Pain, Difficulty Breathing Lying Down, Leg Cramps, Palpitations, Rapid Heart Rate, Shortness of Breath and Swelling of Extremities. Gastrointestinal Present- Bloating, Excessive gas, Hemorrhoids and Indigestion. Not Present- Abdominal Pain, Bloody Stool, Change in Bowel Habits, Chronic diarrhea, Constipation, Difficulty Swallowing, Gets full quickly at meals, Nausea, Rectal Pain and Vomiting. Female Genitourinary Present- Frequency and Urgency. Not Present- Nocturia, Painful Urination and Pelvic Pain. Musculoskeletal Not Present- Back Pain, Joint Pain, Joint Stiffness, Muscle Pain, Muscle Weakness and Swelling of Extremities. Neurological Not Present- Decreased Memory, Fainting, Headaches, Numbness, Seizures, Tingling, Tremor, Trouble walking and Weakness. Psychiatric Present- Anxiety. Not Present- Bipolar, Change in Sleep Pattern, Depression, Fearful and Frequent crying. Endocrine Present- Hair Changes. Not Present- Cold Intolerance, Excessive Hunger, Heat Intolerance, Hot flashes and New Diabetes. Hematology Present- Easy Bruising. Not Present- Blood Thinners, Excessive bleeding, Gland problems, HIV and Persistent  Infections.  Vitals (Chanel Nolan CMA; 02/27/2020 2:48 PM) 02/27/2020 2:48 PM Weight: 176.13 lb Height: 63in Body Surface Area: 1.83 m Body Mass Index: 31.2 kg/m  Temp.: 34F  Pulse: 98 (Regular)         Physical Exam [name] MD; 02/27/2020 3:32 PM)  General Mental Status-Alert. General Appearance-Not in acute distress, Not Sickly. Orientation-Oriented X3. Hydration-Well hydrated. Voice-Normal.  Integumentary Global Assessment Upon inspection and palpation of skin surfaces of the - Axillae: non-tender, no inflammation or ulceration, no drainage. and Distribution of scalp and body hair is normal. General Characteristics Temperature - normal warmth is noted.  Head and Neck Head-normocephalic, atraumatic with no lesions or palpable masses. Face Global Assessment - atraumatic, no absence of expression. Neck Global Assessment - no abnormal movements, no bruit auscultated on the right, no bruit auscultated on the left, no decreased range of motion, non-tender. Trachea-midline. Thyroid Gland Characteristics - non-tender.  Eye Eyeball - Left-Disconjugate gaze, No Nystagmus - Left. Eyeball - Right-Extraocular movements intact, No Nystagmus - Right. Cornea - Left-No Hazy - Left. Cornea - Right-No Hazy - Right. Sclera/Conjunctiva - Left-No scleral icterus, No Discharge - Left. Sclera/Conjunctiva - Right-No scleral icterus, No Discharge - Right. Pupil - Left-Direct reaction to light normal. Pupil - Right-Direct reaction to light normal. Note: Left eye cannot do lateral gaze past midline. Otherwise extraocular movements intact.  ENMT Ears Pinna - Left - no drainage observed, no generalized tenderness observed. Pinna - Right - no drainage observed, no generalized tenderness observed. Nose and Sinuses External Inspection of the Nose - no destructive lesion observed. Inspection of the nares - Left - quiet  respiration. Inspection of the nares - Right - quiet respiration. Mouth and Throat Lips - Upper Lip - no fissures observed, no pallor noted. Lower Lip - no fissures observed, no pallor noted. Nasopharynx - no discharge present. Oral Cavity/Oropharynx - Tongue - no dryness observed. Oral Mucosa - no cyanosis observed. Hypopharynx - no evidence of airway distress observed.  Chest and  Lung Exam Inspection Movements - Normal and Symmetrical. Accessory muscles - No use of accessory muscles in breathing. Palpation Palpation of the chest reveals - Non-tender. Auscultation Breath sounds - Normal and Clear.  Cardiovascular Auscultation Rhythm - Regular. Murmurs & Other Heart Sounds - Auscultation of the heart reveals - No Murmurs and No Systolic Clicks.  Abdomen Inspection Inspection of the abdomen reveals - No Visible peristalsis and No Abnormal pulsations. Umbilicus - No Bleeding, No Urine drainage. Palpation/Percussion Palpation and Percussion of the abdomen reveal - Soft, Non Tender, No Rebound tenderness, No Rigidity (guarding) and No Cutaneous hyperesthesia. Note: 6 x 6 cm mass subxiphoid region. Reducible to a smaller defect. Consistent with reducible epigastric incisional hernia most likely from prior sternotomy from her septal defect heart surgery 30 years ago  Abdomen obese but soft. Not distended. No daistasis recti. No umbilical or other anterior abdominal wall hernias  Female Genitourinary Sexual Maturity Tanner 5 - Adult hair pattern. Note: No inguinal hernias. No lymphadenopathy. No vaginal bleeding nor discharge  Peripheral Vascular Upper Extremity Inspection - Left - No Cyanotic nailbeds - Left, Not Ischemic. Inspection - Right - No Cyanotic nailbeds - Right, Not Ischemic.  Neurologic Neurologic evaluation reveals -normal attention span and ability to concentrate, able to name objects and repeat phrases. Appropriate fund of knowledge , normal sensation and  normal coordination. Mental Status Affect - not angry, not paranoid. Cranial Nerves-Normal Bilaterally. Gait-Normal.  Neuropsychiatric Mental status exam performed with findings of-able to articulate well with normal speech/language, rate, volume and coherence, thought content normal with ability to perform basic computations and apply abstract reasoning and no evidence of hallucinations, delusions, obsessions or homicidal/suicidal ideation.  Musculoskeletal Global Assessment Spine, Ribs and Pelvis - no instability, subluxation or laxity. Right Upper Extremity - no instability, subluxation or laxity.  Lymphatic Head & Neck General Head & Neck Lymphatics: Bilateral - Description - No Localized lymphadenopathy. Axillary General Axillary Region: Bilateral - Description - No Localized lymphadenopathy. Femoral & Inguinal Generalized Femoral & Inguinal Lymphatics: Left - Description - No Localized lymphadenopathy. Right - Description - No Localized lymphadenopathy.    Assessment & Plan [name] MD; 02/27/2020 3:34 PM)  INCISIONAL HERNIA, WITHOUT OBSTRUCTION OR GANGRENE (K43.2) Impression: Obvious abdominal wall mass correlating by CT scan and exam for hernia at the base of her prior sternotomy incision.  It is reducible. It does seem slightly larger from last year.  She is nervous and anxious about surgery but wants something done since she is having more symptoms now. She delayed surgery with a covered pandemic and also having to take care of her daughter with any special needs. Her daughter's issues are more stable now. Covid pandemic seems to be waning. She is wishing to try and proceed and get done hopefully in the next week or so. I cautioned that time but doubt a while so it may be more like in a month or so. I tried to reassure her that she should not be high risk and she has good exercise tolerance. We will try and work on her pain and discomfort post-op. I gave  her extra time to address her concerns and anxieties. That seemed to reassure her. I did caution that she needs time to recover with help at home. Again laparoscopic underlay repair. Primary closure over the defect itself. Hopefully can be an outpatient surgery. Might need to stay overnight depending on her pain and other issues. Hopefully good/nerve block well that out as well.   PREOP -  [place] - ENCOUNTER FOR PREOPERATIVE EXAMINATION FOR GENERAL SURGICAL PROCEDURE (Z01.818)  Current Plans You are being scheduled for surgery- Our schedulers will call you.  You should hear from our office's scheduling department within 5 working days about the location, date, and time of surgery. We try to make accommodations for patient's preferences in scheduling surgery, but sometimes the OR schedule or the surgeon's schedule prevents [place] from making those accommodations.  If you have not heard from our office [phone]) in 5 working days, call the office and ask for your surgeon's nurse.  If you have other questions about your diagnosis, plan, or surgery, call the office and ask for your surgeon's nurse.  Written instructions provided [facility] Consent - Hernia Repair - Ventral/Incisional/Umbilical (Deatra Mcmahen): discussed with patient and provided information. Pt Education - [facility] Hernia Post-Op HCI (Yaritsa Savarino): discussed with patient and provided information. Pt Education - [facility] Pain Control (Neila Teem) Pt Education - Pamphlet Given - Laparoscopic Hernia Repair: discussed with patient and provided information. Pt Education - [facility] Mesh education: discussed with patient and provided information.  [name], MD, FACS, MASCRS Gastrointestinal and Minimally Invasive Surgery  [facility] [address] [place], [place] [address] [phone] Fax [phone] Main/Paging  CONTACT INFORMATION: Weekday (9AM-5PM) concerns: Call [facility] main office at [phone] Weeknight (5PM-9AM)  or Weekend/Holiday concerns: Check [email or web address] for General Surgery [facility] coverage (Please, do not use SecureChat as it is not reliable communication to operating surgeons for immediate patient care)

## 2020-04-12 NOTE — Interval H&P Note (Signed)
History and Physical Interval Note:  04/12/2020 8:45 AM  Deborah Archer  has presented today for surgery, with the diagnosis of EPIGASTRIC INCISIONAL AND NEW ABDOMINAL WALL HERNIA.  The various methods of treatment have been discussed with the patient and family. After consideration of risks, benefits and other options for treatment, the patient has consented to  Procedure(s): [place] (N/A) as a surgical intervention.  The patient's history has been reviewed, patient examined, no change in status, stable for surgery.  I have reviewed the patient's chart and labs.  Questions were answered to the patient's satisfaction.    I have re-reviewed the the patient's records, history, medications, and allergies.  I have re-examined the patient.  I again discussed intraoperative plans and goals of post-operative recovery.  The patient agrees to proceed.  Deborah Archer  [birth year] [id number]  Patient Care Team: [name], NP as PCP - General (Nurse Practitioner) Domingo Pulse, MD as Consulting Physician (Urology) Michael Boston, MD as Consulting Physician (General Surgery) Everitt Amber, MD as Consulting Physician (Ophthalmology)  Patient Active Problem List   Diagnosis Date Noted   Degenerative disc disease, lumbar 01/16/2017   Degenerative disc disease, cervical 01/16/2017   Atrial septal defect 06/09/2014   ANXIETY 03/27/2010   GERD 03/27/2010   CHANGE IN BOWELS 03/27/2010   ABDOMINAL PAIN-RLQ 03/27/2010   Chronic interstitial cystitis 10/06/2009   Incarcerated incisional hernia 11/01/2001    Past Medical History:  Diagnosis Date   Chronic interstitial cystitis 2011   Dr Alona Bene with [facility] follows   Esotropia    GERD (gastroesophageal reflux disease)    Heart murmur    Neuromuscular disorder ([place])    Residual ASD (atrial septal defect) following repair    surgical repair by Dr. Doren Custard in 1987    Past Surgical History:  Procedure Laterality  Date   ASD [place] Right 2009   Dr. Trenton Gammon   CERVICAL SPINE SURGERY  2002   diskectomy, Dr. 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 Her daughter lives nearby, and requires assistance due to relapse of Lyme Disease, undergoing IV treatment with a physician in [place], [place].   Social Determinants of Health   Financial Resource Strain:    Difficulty of Paying Living Expenses:   Food Insecurity:    Worried About Charity fundraiser in the Last Year:    [employer]  of Food in the Last Year:   Transportation Needs:    Film/video editor (Medical):    Lack of Transportation (Non-Medical):   Physical Activity:    Days of Exercise per Week:    Minutes of Exercise per Session:   Stress:    Feeling of Stress :   Social Connections:    Frequency of Communication with Friends and Family:    Frequency of Social Gatherings with Friends and Family:    Attends Religious Services:    Active Member of Clubs or Organizations:     Attends Music therapist:    Marital Status:   Intimate Partner Violence:    Fear of Current or Ex-Partner:    Emotionally Abused:    Physically Abused:    Sexually Abused:     Family History  Problem Relation Age of Onset   Cancer Mother        brain tumor   Heart disease Father        heart attack   Stroke Maternal Grandmother    Stroke Maternal Grandfather    Cancer Paternal Grandmother        Breast   Heart attack Paternal Grandfather     Medications Prior to Admission  Medication Sig Dispense Refill Last Dose   acetaminophen (TYLENOL) 500 MG tablet Take 500-1,000 mg by mouth every 8 (eight) hours as needed (pain).   Past Month at Unknown time   alum & mag hydroxide-simeth (GELUSIL) 200-200-25 MG chewable tablet Chew 1-2 tablets by mouth every 6 (six) hours as needed for indigestion or heartburn.   Past Month at Unknown time   bismuth subsalicylate (PEPTO BISMOL) 262 MG chewable tablet Chew 524 mg by mouth 4 (four) times daily as needed for indigestion or diarrhea or loose stools.   Past Month at Unknown time   Calcium Glycerophosphate (PRELIEF PO) Take 1-2 tablets by mouth with breakfast, with lunch, and with evening meal.   Past Month at Unknown time   Carboxymethylcellulose Sod PF (REFRESH PLUS) 0.5 % SOLN Place 1 drop into both eyes at bedtime.   Past Month at Unknown time   cetirizine (ZYRTEC) 10 MG tablet Take 10 mg by mouth daily as needed for allergies. (Patient not taking: Reported on 04/05/2020)      Digestive Enzymes (DIGESTIVE ENZYME PO) Take 1-2 capsules by mouth with breakfast, with lunch, and with evening meal. Jeannetta Nap Digest Assure   Past Month at Unknown time   lansoprazole (PREVACID) 15 MG capsule Take 15 mg by mouth at bedtime. OTC   04/11/2020 at Unknown time    Current Facility-Administered Medications  Medication Dose Route Frequency Provider Last Rate Last Admin   bupivacaine liposome (EXPAREL) 1.3 % injection 266 mg  20 mL Infiltration  On Call to OR Polly Cobia, RPH       chlorhexidine (PERIDEX) 0.12 % solution 15 mL  15 mL Mouth/Throat Once Finucane, Elizabeth M, DO       Or   MEDLINE mouth rinse  15 mL Mouth Rinse Once Finucane, Elizabeth M, DO       Chlorhexidine Gluconate Cloth 2 % PADS 6 each  6 each Topical Once Michael Boston, MD       And   Chlorhexidine Gluconate Cloth 2 % PADS 6 each  6 each Topical Once Michael Boston, MD       Derrill Memo ON 04/13/2020] feeding supplement (ENSURE PRE-SURGERY) liquid 296 mL  296 mL Oral Once Michael Boston, MD  feeding supplement (ENSURE PRE-SURGERY) liquid 592 mL  592 mL Oral Once Michael Boston, MD       gentamicin (GARAMYCIN) 320 mg in dextrose 5 % 100 mL IVPB  320 mg Intravenous On Call to Olds, Cindie Laroche, RPH       lactated ringers infusion   Intravenous Continuous Myrtie Soman, MD       lactated ringers infusion   Intravenous Continuous Pervis Hocking, DO 10 mL/hr at 04/12/20 3762 Continued from Pre-op at 04/12/20 8315     Allergies  Allergen Reactions   Amoxicillin Diarrhea   Nsaids Other (See Comments)    Stomach aches   Latex Other (See Comments)    Claims redness around mouth at dentist    BP (!) 158/72    Pulse 79    Temp 98.3 F (36.8 C) (Oral)    Resp 16    SpO2 99%   Labs: Results for orders placed or performed during the hospital encounter of 04/10/20 (from the past 48 hour(s))  SARS CORONAVIRUS 2 (TAT 6-24 HRS) Nasopharyngeal Nasopharyngeal Swab     Status: None   Collection Time: 04/10/20 11:12 AM   Specimen: Nasopharyngeal Swab  Result Value Ref Range   SARS Coronavirus 2 NEGATIVE NEGATIVE    Comment: (NOTE) SARS-CoV-2 target nucleic acids are NOT DETECTED.  The SARS-CoV-2 RNA is generally detectable in upper and lower respiratory specimens during the acute phase of infection. Negative results do not preclude SARS-CoV-2 infection, do not rule out co-infections with other pathogens, and should not be used as the sole basis for treatment or  other patient management decisions. Negative results must be combined with clinical observations, patient history, and epidemiological information. The expected result is Negative.  Fact Sheet for Patients: SugarRoll.be  Fact Sheet for Healthcare Providers: https://www.woods-mathews.com/  This test is not yet approved or cleared by the [place] FDA and  has been authorized for detection and/or diagnosis of SARS-CoV-2 by FDA under an Emergency Use Authorization (EUA). This EUA will remain  in effect (meaning this test can be used) for the duration of the COVID-19 declaration under Se ction 564(b)(1) of the Act, 21 U.S.C. section 360bbb-3(b)(1), unless the authorization is terminated or revoked sooner.  Performed at [facility], [place] [address] [place], [place] [address]     Imaging / Studies: No results found.   [name], M.D., F.A.C.S. Gastrointestinal and Minimally Invasive Surgery [facility] [address] [place], [place] [address] [phone] Main / Paging  04/12/2020 8:45 AM    [name]

## 2020-04-12 NOTE — Addendum Note (Signed)
Addendum  created 04/12/20 1651 by Effie Berkshire, MD   Clinical Note Signed

## 2020-04-13 ENCOUNTER — Encounter (HOSPITAL_COMMUNITY): Payer: Self-pay | Admitting: Surgery

## 2020-04-13 DIAGNOSIS — K430 Incisional hernia with obstruction, without gangrene: Secondary | ICD-10-CM | POA: Diagnosis not present

## 2020-04-13 MED ORDER — MECLIZINE HCL 12.5 MG PO TABS: 12.5000 mg | ORAL_TABLET | Freq: Three times a day (TID) | ORAL | 2 refills | Status: DC | PRN

## 2020-04-13 NOTE — Discharge Summary (Signed)
Physician Discharge Summary    Patient ID: Deborah Archer MRN: [id number] DOB/AGE: Jul 16, 1951  69 y.o.  Patient Care Team: [name], NP as PCP - General (Nurse Practitioner) Domingo Pulse, MD as Consulting Physician (Urology) Michael Boston, MD as Consulting Physician (General Surgery) Everitt Amber, MD as Consulting Physician (Ophthalmology)  Admit date: 04/12/2020  Discharge date: 04/13/2020  Hospital Stay = 0 days    Discharge Diagnoses:  Principal Problem:   Incarcerated incisional hernia s/p lap repair w mesh 04/12/2020 Active Problems:   Status post repair of ventral hernia   Dizziness   1 Day Post-Op  04/12/2020  POST-OPERATIVE DIAGNOSIS:   INCARCERATED INCISIONAL HERNIA  SURGERY:  04/12/2020  Procedure(s): LAPAROSCOPIC VENTRAL WALL HERNIA REPAIR WITH MESH  SURGEON:    Surgeon(s): Michael Boston, MD  Consults: None  Hospital Course:   The patient underwent the surgery above.  Postoperatively, the patient gradually mobilized and advanced to a solid diet.  Pain and other symptoms were treated aggressively.  Patient struggled with dizziness and vertigo.  Improved with scopolamine patch and meclizine.  Feeling much better on postoperative day 1.  By the time of discharge, the patient was walking well the hallways, eating food, having flatus.  Pain was well-controlled on an oral medications.  Based on meeting discharge criteria and continuing to recover, I felt it was safe for the patient to be discharged from the hospital to further recover with close followup. Postoperative recommendations were discussed in detail.  They are written as well.  Discharged Condition: good  Discharge Exam: Blood pressure 121/64, pulse 67, temperature 98.3 F (36.8 C), temperature source Oral, resp. rate 18, height 5' 2.5" (1.588 m), weight 78.7 kg, SpO2 96 %.  General: Pt awake/alert/oriented x4 in No acute distress Eyes: PERRL, normal EOM.  Sclera clear.  No icterus Neuro: CN  II-XII intact w/o focal sensory/motor deficits. Lymph: No head/neck/groin lymphadenopathy Psych:  No delerium/psychosis/paranoia HENT: Normocephalic, Mucus membranes moist.  No thrush Neck: Supple, No tracheal deviation Chest:  No chest wall pain w good excursion CV:  Pulses intact.  Regular rhythm MS: Normal AROM mjr joints.  No obvious deformity Abdomen: Soft.  Nondistended.  Mildly tender at incisions only.  No evidence of peritonitis.  No incarcerated hernias. Ext:  SCDs BLE.  No mjr edema.  No cyanosis Skin: No petechiae / purpura   Disposition:    Follow-up Information     Michael Boston, MD. Schedule an appointment as soon as possible for a visit in 3 weeks.   Specialty: General Surgery Why: To follow up after your operation Contact information: [place] [address] [phone]                 Discharge disposition: 01-Home or Self Care       Discharge Instructions     Call MD for:   Complete by: As directed    FEVER >101.5 F (Temperatures <101.56F occasionally happen and are not significant)   Call MD for:  extreme fatigue   Complete by: As directed    Call MD for:  persistant dizziness or light-headedness   Complete by: As directed    Call MD for:  persistant nausea and vomiting   Complete by: As directed    Call MD for:  redness, tenderness, or signs of infection (pain, swelling, redness, odor or green/yellow discharge around incision site)   Complete by: As directed    Call MD for:  severe uncontrolled pain  Complete by: As directed    Diet - low sodium heart healthy   Complete by: As directed    Follow a light diet the first few days at home.  Start with a bland diet such as soups, liquids, starchy foods, low fat foods, etc.   If you feel full, bloated, or constipated, stay on a full liquid or pureed/blenderized diet for a few days until you feel better and no longer constipated. Gradually get back to a regular solid  diet.  Avoid fast food or heavy meals the first week as you are more likely to get nauseated.   Discharge instructions   Complete by: As directed    One the day of your discharge from the hospital (or the next business weekday), please call [facility] to set up or confirm an appointment to see your surgeon in the office for a follow-up appointment.  Usually it is 2-3 weeks after your surgery.  Other concerns If you are not getting better after two weeks or are noticing you are getting worse, contact our office [phone] for further advice.  We may need to adjust your medications, re-evaluate you in the office, send you to the emergency room, or see what other things we can do to help. The clinic staff is available to answer your questions during regular business hours (8:30am-5pm).  Please don't hesitate to call and ask to speak to one of our nurses for clinical concerns.    A surgeon from [facility] is always on call at the hospitals 24 hours/day If you have a medical emergency, go to the nearest emergency room or call 911.   Driving Restrictions   Complete by: As directed    You may drive when you are no longer taking prescription pain medication, you can comfortably wear a seatbelt, and you can safely maneuver your car and apply brakes.   If the dressing is still on your incision site when you go home, remove it on the third day after your surgery date. Remove dressing if it begins to fall off, or if it is dirty or damaged before the third day.   Complete by: As directed    Remove your waterproof dressings, skin tapes, and other bandages 3 days after surgery.    You may leave the incision(s) open to air.   You may replace a dressing/Band-Aid to cover the incision for comfort if you wish.  STERISTRIPS:  You have skin tapes called Steristrips on your incisions.  Leave them in place, and they will fall off on their own like a scab.  You may trim any edges that  curl up with clean scissors.  You may remove them in the shower in a week.   Increase activity slowly   Complete by: As directed    Lifting restrictions   Complete by: As directed    You may resume regular (light) daily activities beginning the next day-such as daily self-care, walking, climbing stairs-gradually increasing activities as tolerated.   If you can walk 30 minutes without difficulty, it is safe to try more intense activity such as jogging, treadmill, bicycling, low-impact aerobics, swimming, etc. Save the most intensive and strenuous activity for last such as sit-ups, heavy lifting, contact sports, etc   Refrain from any heavy lifting or straining until you are off narcotics for pain control.   DO NOT PUSH THROUGH PAIN.   Let pain be your guide: If it hurts to do something, don't do it.  Pain is your body warning you to avoid that activity for another week until the pain goes down.   May shower / Bathe   Complete by: As directed    Wash / shower every day.  You may shower over the dressings as they are waterproof.  Continue to shower over incision(s) after the dressing is off.   May walk up steps   Complete by: As directed    Sexual Activity Restrictions   Complete by: As directed    You may have sexual intercourse when it is comfortable. If it hurts to do something, stop.       Allergies as of 04/13/2020       Reactions   Amoxicillin Diarrhea   Nsaids Other (See Comments)   Stomach aches   Latex Other (See Comments)   Claims redness around mouth at dentist        Medication List     TAKE these medications    acetaminophen 500 MG tablet Commonly known as: TYLENOL Take 500-1,000 mg by mouth every 8 (eight) hours as needed (pain).   bismuth subsalicylate 681 MG chewable tablet Commonly known as: PEPTO BISMOL Chew 524 mg by mouth 4 (four) times daily as needed for indigestion or diarrhea or loose stools.   cetirizine 10 MG tablet Commonly known as:  ZYRTEC Take 10 mg by mouth daily as needed for allergies.   DIGESTIVE ENZYME PO Take 1-2 capsules by mouth with breakfast, with lunch, and with evening meal. Jeannetta Nap Digest Assure   Gelusil 200-200-25 MG chewable tablet Generic drug: alum & mag hydroxide-simeth Chew 1-2 tablets by mouth every 6 (six) hours as needed for indigestion or heartburn.   lansoprazole 15 MG capsule Commonly known as: PREVACID Take 15 mg by mouth at bedtime. OTC   meclizine 12.5 MG tablet Commonly known as: ANTIVERT Take 1 tablet (12.5 mg total) by mouth 3 (three) times daily as needed for dizziness or nausea.   PRELIEF PO Take 1-2 tablets by mouth with breakfast, with lunch, and with evening meal.   Refresh Plus 0.5 % Soln Generic drug: Carboxymethylcellulose Sod PF Place 1 drop into both eyes at bedtime.   traMADol 50 MG tablet Commonly known as: ULTRAM Take 1-2 tablets (50-100 mg total) by mouth every 6 (six) hours as needed for moderate pain or severe pain.               Discharge Care Instructions  (From admission, onward)           Start     Ordered   04/12/20 0000  If the dressing is still on your incision site when you go home, remove it on the third day after your surgery date. Remove dressing if it begins to fall off, or if it is dirty or damaged before the third day.       Comments: Remove your waterproof dressings, skin tapes, and other bandages 3 days after surgery.    You may leave the incision(s) open to air.   You may replace a dressing/Band-Aid to cover the incision for comfort if you wish.  STERISTRIPS:  You have skin tapes called Steristrips on your incisions.  Leave them in place, and they will fall off on their own like a scab.  You may trim any edges that curl up with clean scissors.  You may remove them in the shower in a week.   04/12/20 0850            Significant  Diagnostic Studies:  Results for orders placed or performed during the hospital  encounter of 04/10/20 (from the past 72 hour(s))  SARS CORONAVIRUS 2 (TAT 6-24 HRS) Nasopharyngeal Nasopharyngeal Swab     Status: None   Collection Time: 04/10/20 11:12 AM   Specimen: Nasopharyngeal Swab  Result Value Ref Range   SARS Coronavirus 2 NEGATIVE NEGATIVE    Comment: (NOTE) SARS-CoV-2 target nucleic acids are NOT DETECTED.  The SARS-CoV-2 RNA is generally detectable in upper and lower respiratory specimens during the acute phase of infection. Negative results do not preclude SARS-CoV-2 infection, do not rule out co-infections with other pathogens, and should not be used as the sole basis for treatment or other patient management decisions. Negative results must be combined with clinical observations, patient history, and epidemiological information. The expected result is Negative.  Fact Sheet for Patients: SugarRoll.be  Fact Sheet for Healthcare Providers: https://www.woods-mathews.com/  This test is not yet approved or cleared by the [place] FDA and  has been authorized for detection and/or diagnosis of SARS-CoV-2 by FDA under an Emergency Use Authorization (EUA). This EUA will remain  in effect (meaning this test can be used) for the duration of the COVID-19 declaration under Se ction 564(b)(1) of the Act, 21 U.S.C. section 360bbb-3(b)(1), unless the authorization is terminated or revoked sooner.  Performed at [facility], [place] [address], [place], [place] [address]     No results found.  Past Medical History:  Diagnosis Date   Chronic interstitial cystitis 2011   Dr Alona Bene with [phone] follows   Esotropia    GERD (gastroesophageal reflux disease)    Heart murmur    Neuromuscular disorder ([place])    Residual ASD (atrial septal defect) following repair    surgical repair by Dr. Doren Custard in 1987    Past Surgical History:  Procedure Laterality Date   ASD REPAIR  1986   CARPAL TUNNEL RELEASE Right 2009    Dr. Trenton Gammon   CERVICAL SPINE SURGERY  2002   diskectomy, Dr. 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 Her daughter lives nearby, and requires assistance due to relapse of Lyme Disease, undergoing IV treatment with a physician in [place], [place].   Social Determinants of Health   Financial Resource Strain:    Difficulty of Paying Living Expenses:   Food Insecurity:    Worried About Charity fundraiser in the Last Year:  Ran Out of Food in the Last Year:   Transportation Needs:    Film/video editor (Medical):    Lack of Transportation (Non-Medical):   Physical Activity:    Days of Exercise per Week:    Minutes of Exercise per Session:   Stress:    Feeling of Stress :   Social Connections:    Frequency of Communication with Friends and Family:    Frequency of Social Gatherings with Friends and Family:    Attends Religious Services:    Active Member of Clubs or Organizations:    Attends Music therapist:    Marital Status:    Intimate Partner Violence:    Fear of Current or Ex-Partner:    Emotionally Abused:    Physically Abused:    Sexually Abused:     Family History  Problem Relation Age of Onset   Cancer Mother        brain tumor   Heart disease Father        heart attack   Stroke Maternal Grandmother    Stroke Maternal Grandfather    Cancer Paternal Grandmother        Breast   Heart attack Paternal Grandfather     Current Facility-Administered Medications  Medication Dose Route Frequency Provider Last Rate Last Admin   0.9 %  sodium chloride infusion   Intravenous Continuous Michael Boston, MD   Stopped at 04/13/20 0555   0.9 %  sodium chloride infusion  250 mL Intravenous PRN Michael Boston, MD       acetaminophen (TYLENOL) tablet 1,000 mg  1,000 mg Oral Tor Netters, MD   1,000 mg at 04/13/20 0539   bisacodyl (DULCOLAX) suppository 10 mg  10 mg Rectal Daily PRN Michael Boston, MD       diphenhydrAMINE (BENADRYL) 12.5 MG/5ML elixir 12.5 mg  12.5 mg Oral Q6H PRN Michael Boston, MD       Or   diphenhydrAMINE (BENADRYL) injection 12.5 mg  12.5 mg Intravenous Q6H PRN Michael Boston, MD   12.5 mg at 04/12/20 2102   enoxaparin (LOVENOX) injection 40 mg  40 mg Subcutaneous Q24H Michael Boston, MD       feeding supplement (ENSURE SURGERY) liquid 237 mL  237 mL Oral BID BM Michael Boston, MD       gabapentin (NEURONTIN) capsule 300 mg  300 mg Oral BID Michael Boston, MD   300 mg at 04/12/20 2049   HYDROmorphone (DILAUDID) injection 0.5-2 mg  0.5-2 mg Intravenous Q2H PRN Michael Boston, MD       lactated ringers bolus 1,000 mL  1,000 mL Intravenous Q8H PRN Jamell Archer, Remo Lipps, MD       lactated ringers infusion 1,000 mL  1,000 mL Intravenous Q8H PRN Amar Sippel, Remo Lipps, MD       lip balm (CARMEX) ointment 1 application  1 application Topical BID Michael Boston, MD   1 application at 74/12/87 2050   LORazepam (ATIVAN) injection 0.5-1 mg  0.5-1 mg Intravenous Q8H PRN Michael Boston, MD       magic mouthwash  15 mL Oral QID  PRN Michael Boston, MD       meclizine (ANTIVERT) tablet 12.5 mg  12.5 mg Oral TID PRN Michael Boston, MD   12.5 mg at 04/13/20 0539   methocarbamol (ROBAXIN) tablet 750 mg  750 mg Oral Q6H PRN Michael Boston, MD   750 mg at 04/13/20 0539   metoprolol tartrate (LOPRESSOR) injection 5 mg  5 mg Intravenous Q6H PRN  Michael Boston, MD       ondansetron (ZOFRAN-ODT) disintegrating tablet 4 mg  4 mg Oral Q6H PRN Michael Boston, MD       Or   ondansetron [facility]) injection 4 mg  4 mg Intravenous Q6H PRN Michael Boston, MD       oxyCODONE (Oxy IR/ROXICODONE) immediate release tablet 5-10 mg  5-10 mg Oral Q4H PRN Michael Boston, MD       polyethylene glycol (MIRALAX / GLYCOLAX) packet 17 g  17 g Oral Daily PRN Michael Boston, MD       prochlorperazine (COMPAZINE) tablet 10 mg  10 mg Oral Q6H PRN Michael Boston, MD       Or   prochlorperazine (COMPAZINE) injection 5-10 mg  5-10 mg Intravenous Q6H PRN Michael Boston, MD       scopolamine (TRANSDERM-SCOP) 1 MG/3DAYS 1.5 mg  1 patch Transdermal Cyndia Bent, MD   1.5 mg at 04/12/20 2051   simethicone (MYLICON) chewable tablet 40 mg  40 mg Oral Q6H PRN Michael Boston, MD       sodium chloride flush (NS) 0.9 % injection 3 mL  3 mL Intravenous Gorden Harms, MD       sodium chloride flush (NS) 0.9 % injection 3 mL  3 mL Intravenous PRN Michael Boston, MD       zolpidem (AMBIEN) tablet 5 mg  5 mg Oral QHS PRN Michael Boston, MD         Allergies  Allergen Reactions   Amoxicillin Diarrhea   Nsaids Other (See Comments)    Stomach aches   Latex Other (See Comments)    Claims redness around mouth at dentist    Signed: Morton Peters, MD, FACS, MASCRS Gastrointestinal and Minimally Invasive Surgery  [facility] [address], [place], [place] [address] [phone] Fax [phone] Main/Paging  CONTACT INFORMATION: Weekday (9AM-5PM) concerns: Call [facility] main office at [phone] Weeknight (5PM-9AM) or  Weekend/Holiday concerns: Check [email or web address] for General Surgery [facility] coverage (Please, do not use SecureChat as it is not reliable communication to operating surgeons for immediate patient care)      04/13/2020, 8:12 AM

## 2020-04-13 NOTE — Plan of Care (Signed)
Instructions were reviewed with patient. All questions were answered. Patient was transported to main entrance by wheelchair. ° °

## 2020-11-23 ENCOUNTER — Other Ambulatory Visit: Payer: Self-pay

## 2020-11-23 ENCOUNTER — Ambulatory Visit (AMBULATORY_SURGERY_CENTER): Payer: Self-pay

## 2020-11-23 VITALS — Ht 62.0 in | Wt 172.6 lb

## 2020-11-23 DIAGNOSIS — Z1211 Encounter for screening for malignant neoplasm of colon: Secondary | ICD-10-CM

## 2020-11-23 MED ORDER — PLENVU 140 G PO SOLR
1.0000 | ORAL | 0 refills | Status: DC
Start: 2020-11-23 — End: 2020-12-04

## 2020-11-23 NOTE — Progress Notes (Signed)
No egg or soy allergy known to patient  No issues with past sedation with any surgeries or procedures No intubation problems in the past  No FH of Malignant Hyperthermia No diet pills per patient No home 02 use per patient  No blood thinners per patient  Pt denies issues with constipation  No A fib or A flutter  COVID 19 guidelines implemented in PV today with Pt and RN  Pt is fully vaccinated for Covid x 2= Pt denies loose or missing teeth;  Patient reports dental implants, capped and bonded teeth; Patient denies dentures or partials; Coupon given to pt in PV today , Code to Pharmacy and  NO PA's for preps discussed with pt in PV today  Discussed with pt there will be an out-of-pocket cost for prep and that varies from $0 to 70 dollars  Due to the COVID-19 pandemic we are asking patients to follow certain guidelines.  Pt aware of COVID protocols and [facility] guidelines

## 2020-11-29 ENCOUNTER — Encounter: Payer: Self-pay | Admitting: Gastroenterology

## 2020-12-04 ENCOUNTER — Encounter: Payer: Self-pay | Admitting: Gastroenterology

## 2020-12-04 ENCOUNTER — Ambulatory Visit (AMBULATORY_SURGERY_CENTER): Payer: Medicare PPO | Admitting: Gastroenterology

## 2020-12-04 ENCOUNTER — Other Ambulatory Visit: Payer: Self-pay

## 2020-12-04 VITALS — BP 138/60 | HR 63 | Temp 98.2°F | Resp 27 | Ht 62.0 in | Wt 172.0 lb

## 2020-12-04 DIAGNOSIS — Z86010 Personal history of colonic polyps: Secondary | ICD-10-CM | POA: Diagnosis not present

## 2020-12-04 DIAGNOSIS — D125 Benign neoplasm of sigmoid colon: Secondary | ICD-10-CM

## 2020-12-04 DIAGNOSIS — D123 Benign neoplasm of transverse colon: Secondary | ICD-10-CM | POA: Diagnosis not present

## 2020-12-04 MED ORDER — SODIUM CHLORIDE 0.9 % IV SOLN: 500.0000 mL | Freq: Once | INTRAVENOUS | Status: DC

## 2020-12-04 NOTE — Progress Notes (Signed)
Pt's states no medical or surgical changes since previsit or office visit. 

## 2020-12-04 NOTE — Op Note (Signed)
[facility] Patient Name: Deborah Archer Procedure Date: 12/04/2020 9:48 AM MRN: [id number] Endoscopist: Ladene Artist , MD Age: 70 Referring MD:  Date of Birth: 1951/01/11 Gender: Female Account #: [id number] Procedure:                Colonoscopy Indications:              Surveillance: Personal history of adenomatous                            polyps on last colonoscopy > 5 years ago Medicines:                Monitored Anesthesia Care Procedure:                Pre-Anesthesia Assessment:                           - Prior to the procedure, a History and Physical                            was performed, and patient medications and                            allergies were reviewed. The patient's tolerance of                            previous anesthesia was also reviewed. The risks                            and benefits of the procedure and the sedation                            options and risks were discussed with the patient.                            All questions were answered, and informed consent                            was obtained. Prior Anticoagulants: The patient has                            taken no previous anticoagulant or antiplatelet                            agents. ASA Grade Assessment: II - A patient with                            mild systemic disease. After reviewing the risks                            and benefits, the patient was deemed in                            satisfactory condition to undergo the procedure.  After obtaining informed consent, the colonoscope                            was passed under direct vision. Throughout the                            procedure, the patient's blood pressure, pulse, and                            oxygen saturations were monitored continuously. The                            Olympus CF-HQ190L (Serial# [id number]) Colonoscope was                            introduced through the  anus and advanced to the the                            cecum, identified by appendiceal orifice and                            ileocecal valve. The ileocecal valve, appendiceal                            orifice, and rectum were photographed. The quality                            of the bowel preparation was excellent. The                            colonoscopy was performed without difficulty. The                            patient tolerated the procedure well. Scope In: 10:02:24 AM Scope Out: 10:18:17 AM Scope Withdrawal Time: 0 hours 12 minutes 56 seconds  Total Procedure Duration: 0 hours 15 minutes 53 seconds  Findings:                 The perianal and digital rectal examinations were                            normal.                           Two sessile polyps were found in the sigmoid colon                            and transverse colon. The polyps were 6 to 7 mm in                            size. These polyps were removed with a cold snare.                            Resection and retrieval were complete.  Internal hemorrhoids were found during                            retroflexion. The hemorrhoids were small and Grade                            I (internal hemorrhoids that do not prolapse).                           The exam was otherwise without abnormality on                            direct and retroflexion views. Complications:            No immediate complications. Estimated blood loss:                            None. Estimated Blood Loss:     Estimated blood loss: none. Impression:               - Two 6 to 7 mm polyps in the sigmoid colon and in                            the transverse colon, removed with a cold snare.                            Resected and retrieved.                           - Internal hemorrhoids. Recommendation:           - Repeat colonoscopy date to be determined after                            pending pathology  results are reviewed for                            surveillance.                           - Patient has a contact number available for                            emergencies. The signs and symptoms of potential                            delayed complications were discussed with the                            patient. Return to normal activities tomorrow.                            Written discharge instructions were provided to the                            patient.                           -  Resume previous diet.                           - Continue present medications.                           - Await pathology results. Ladene Artist, MD 12/04/2020 10:26:14 AM This report has been signed electronically.

## 2020-12-04 NOTE — Progress Notes (Signed)
Pt states she didn't pass air while in RR- abdomen soft and denies pass.  Told to ambulate and drink warm fluids if needed; understanding voiced

## 2020-12-04 NOTE — Progress Notes (Signed)
pt tolerated well. VSS. awake and to recovery. Report given to RN.  

## 2020-12-04 NOTE — Progress Notes (Signed)
Called to room to assist during endoscopic procedure.  Patient ID and intended procedure confirmed with present staff. Received instructions for my participation in the procedure from the performing physician.  

## 2020-12-04 NOTE — Patient Instructions (Signed)
Please read over handouts about polyps and hemorrhoids  Await pathology  Continue your normal medications  YOU HAD AN ENDOSCOPIC PROCEDURE TODAY AT [facility]:   Refer to the procedure report that was given to you for any specific questions about what was found during the examination.  If the procedure report does not answer your questions, please call your gastroenterologist to clarify.  If you requested that your care partner not be given the details of your procedure findings, then the procedure report has been included in a sealed envelope for you to review at your convenience later.  YOU SHOULD EXPECT: Some feelings of bloating in the abdomen. Passage of more gas than usual.  Walking can help get rid of the air that was put into your GI tract during the procedure and reduce the bloating. If you had a lower endoscopy (such as a colonoscopy or flexible sigmoidoscopy) you may notice spotting of blood in your stool or on the toilet paper. If you underwent a bowel prep for your procedure, you may not have a normal bowel movement for a few days.  Please Note:  You might notice some irritation and congestion in your nose or some drainage.  This is from the oxygen used during your procedure.  There is no need for concern and it should clear up in a day or so.  SYMPTOMS TO REPORT IMMEDIATELY:   Following lower endoscopy (colonoscopy or flexible sigmoidoscopy):  Excessive amounts of blood in the stool  Significant tenderness or worsening of abdominal pains  Swelling of the abdomen that is new, acute  Fever of 100F or higher   For urgent or emergent issues, a gastroenterologist can be reached at any hour by calling [phone]. Do not use MyChart messaging for urgent concerns.    DIET:  We do recommend a small meal at first, but then you may proceed to your regular diet.  Drink plenty of fluids but you should avoid alcoholic beverages for 24 hours.  ACTIVITY:  You should  plan to take it easy for the rest of today and you should NOT DRIVE or use heavy machinery until tomorrow (because of the sedation medicines used during the test).    FOLLOW UP: Our staff will call the number listed on your records 48-72 hours following your procedure to check on you and address any questions or concerns that you may have regarding the information given to you following your procedure. If we do not reach you, we will leave a message.  We will attempt to reach you two times.  During this call, we will ask if you have developed any symptoms of COVID 19. If you develop any symptoms (ie: fever, flu-like symptoms, shortness of breath, cough etc.) before then, please call [phone].  If you test positive for Covid 19 in the 2 weeks post procedure, please call and report this information to [place].    If any biopsies were taken you will be contacted by phone or by letter within the next 1-3 weeks.  Please call us at [phone] if you have not heard about the biopsies in 3 weeks.    SIGNATURES/CONFIDENTIALITY: You and/or your care partner have signed paperwork which will be entered into your electronic medical record.  These signatures attest to the fact that that the information above on your After Visit Summary has been reviewed and is understood.  Full responsibility of the confidentiality of this discharge information lies with you and/or your care-partner.

## 2020-12-06 ENCOUNTER — Telehealth: Payer: Self-pay

## 2020-12-06 NOTE — Telephone Encounter (Signed)
  Follow up Call-  Call back number 12/04/2020  Post procedure Call Back phone  # [phone]  Permission to leave phone message Yes  Some recent data might be hidden     Patient questions:  Do you have a fever, pain , or abdominal swelling? No. Pain Score  0 *  Have you tolerated food without any problems? Yes.    Have you been able to return to your normal activities? Yes.    Do you have any questions about your discharge instructions: Diet   No. Medications  No. Follow up visit  No.  Do you have questions or concerns about your Care? No.  Actions: * If pain score is 4 or above: No action needed, pain <4. 1. Have you developed a fever since your procedure? no  2.   Have you had an respiratory symptoms (SOB or cough) since your procedure? no  3.   Have you tested positive for COVID 19 since your procedure no  4.   Have you had any family members/close contacts diagnosed with the COVID 19 since your procedure?  no   If yes to any of these questions please route to Joylene John, RN and Joella Prince, RN

## 2020-12-12 ENCOUNTER — Encounter: Payer: Self-pay | Admitting: Gastroenterology

## 2021-04-22 ENCOUNTER — Other Ambulatory Visit: Payer: Self-pay

## 2021-04-22 ENCOUNTER — Ambulatory Visit (INDEPENDENT_AMBULATORY_CARE_PROVIDER_SITE_OTHER): Payer: Medicare PPO

## 2021-04-22 ENCOUNTER — Encounter: Payer: Self-pay | Admitting: Podiatry

## 2021-04-22 ENCOUNTER — Ambulatory Visit: Payer: Medicare PPO | Admitting: Podiatry

## 2021-04-22 DIAGNOSIS — M2021 Hallux rigidus, right foot: Secondary | ICD-10-CM

## 2021-04-22 DIAGNOSIS — M778 Other enthesopathies, not elsewhere classified: Secondary | ICD-10-CM

## 2021-04-22 DIAGNOSIS — B351 Tinea unguium: Secondary | ICD-10-CM | POA: Diagnosis not present

## 2021-04-22 DIAGNOSIS — M79671 Pain in right foot: Secondary | ICD-10-CM | POA: Diagnosis not present

## 2021-04-29 NOTE — Progress Notes (Signed)
Subjective:   Patient ID: Deborah Archer, female   DOB: 69 y.o.   MRN: [id number]   HPI 70 year old female presents the office today for concerns of toenail fungus on her right big toe does not cause any significant pain denies any swelling or redness or any drainage.  She also gets a corn inside of her right foot near the fifth toe which occasionally causes discomfort.  She has used over-the-counter Dr. Felicie Morn pads.  She states on her right foot near the third, fourth, fifth toes she feels a "ropy" sensation.  No recent injury.  No radiating pain.   Review of Systems  All other systems reviewed and are negative.  Past Medical History:  Diagnosis Date   Allergy    seasonal allergies   Arthritis    bilateral knees and hips   Chronic interstitial cystitis 2011   Dr Alona Bene with [phone] follows   Esotropia    GERD (gastroesophageal reflux disease)    OTC meds   Heart murmur    congenital heart murmur   Hyperlipidemia    diet controlled (11/23/2020)   Neuromuscular disorder (HCC)    Residual ASD (atrial septal defect) following repair    surgical repair by Dr. Doren Custard in 1987    Past Surgical History:  Procedure Laterality Date   ASD REPAIR  1986   open heart repair   CARPAL TUNNEL RELEASE Right 2009   Dr. Trenton Gammon   CERVICAL SPINE SURGERY  2002   diskectomy, Dr. Trenton Gammon   EYE SURGERY Bilateral 2010, 2015   VI nerve paresis   STRABISMUS SURGERY Left 10/23/2017   Procedure: STRABISMUS REPAIR LEFT EYE;  Surgeon: Everitt Amber, MD;  Location: [facility];  Service: Ophthalmology;  Laterality: Left;   VENTRAL HERNIA REPAIR N/A 04/12/2020   Procedure: LAPAROSCOPIC VENTRAL WALL HERNIA REPAIR WITH MESH;  Surgeon: Michael Boston, MD;  Location: [facility];  Service: General;  Laterality: N/A;     Current Outpatient Medications:    acetaminophen (TYLENOL) 500 MG tablet, Take 500-1,000 mg by mouth every 8 (eight) hours as needed (pain)., Disp: , Rfl:    alum & mag  hydroxide-simeth (GELUSIL) 200-200-25 MG chewable tablet, Chew 1-2 tablets by mouth every 6 (six) hours as needed for indigestion or heartburn., Disp: , Rfl:    bismuth subsalicylate (PEPTO BISMOL) 262 MG chewable tablet, Chew 524 mg by mouth 4 (four) times daily as needed for indigestion or diarrhea or loose stools., Disp: , Rfl:    Calcium Glycerophosphate (PRELIEF PO), Take 1-2 tablets by mouth with breakfast, with lunch, and with evening meal., Disp: , Rfl:    Carboxymethylcellulose Sod PF (REFRESH PLUS) 0.5 % SOLN, Place 1 drop into both eyes at bedtime., Disp: , Rfl:    Digestive Enzymes (DIGESTIVE ENZYME PO), Take 1-2 capsules by mouth with breakfast, with lunch, and with evening meal. Jeannetta Nap Digest Assure, Disp: , Rfl:    lansoprazole (PREVACID) 15 MG capsule, Take 15 mg by mouth at bedtime. OTC, Disp: , Rfl:   Allergies  Allergen Reactions   Amoxicillin Diarrhea   Clavulanic Acid    Nsaids Other (See Comments)    Stomach aches   Latex Other (See Comments)    Claims redness around mouth at dentist          Objective:  Physical Exam  General: AAO x3, NAD  Dermatological: Hallux nail on the right side is now hypertrophic, dystrophic with yellow discoloration.  No hyperkeratotic changes.  No open lesions.  Hyperkeratotic lesion submetatarsal 5 right foot without any underlying ulceration drainage or signs of infection.  Vascular: Dorsalis Pedis artery and Posterior Tibial artery pedal pulses are 2/4 bilateral with immedate capillary fill time. There is no pain with calf compression, swelling, warmth, erythema.   Neruologic: Grossly intact via light touch bilateral. Negative Tinel sign  Musculoskeletal: There is no area of pinpoint tenderness identified today.  Tenderness along the third interspace.  No edema, erythema.  Decreased range of motion of first MPJ.  Muscular strength 5/5 in all groups tested bilateral.  Gait: Unassisted, Nonantalgic.       Assessment:    70 year old female with capsulitis, neuroma right foot; onychomycosis; hyperkeratotic lesion     Plan:  -Treatment options discussed including all alternatives, risks, and complications -Etiology of symptoms were discussed -X-rays were obtained and reviewed with the patient.  Arthritic changes present the first MPJ.  No evidence of acute fracture. -I do think her foot pain on the right side biomechanical in nature.  Discussed shoes and arch supports.  Offered steroid injection but she wants to hold off on this.  Consider oral steroids as she cannot do anti-inflammatories.  If symptoms continue consider MRI. -As a courtesy debride the hyperkeratotic lesion without any complications or bleeding.  Moisturizer and offloading daily. -Discussed other treatment options for nail fungus.  She can start over-the-counter medication discussed Fungi-Nail.    Trula Slade DPM

## 2022-03-20 ENCOUNTER — Other Ambulatory Visit: Payer: Self-pay | Admitting: Neurosurgery

## 2022-03-20 DIAGNOSIS — D329 Benign neoplasm of meninges, unspecified: Secondary | ICD-10-CM

## 2022-04-10 ENCOUNTER — Ambulatory Visit
Admission: RE | Admit: 2022-04-10 | Discharge: 2022-04-10 | Disposition: A | Discharge status: Home / Self Care | Payer: Medicare PPO | Source: Ambulatory Visit | Attending: Neurosurgery | Admitting: Neurosurgery

## 2022-04-10 DIAGNOSIS — D329 Benign neoplasm of meninges, unspecified: Secondary | ICD-10-CM

## 2022-04-10 MED ORDER — GADOBENATE DIMEGLUMINE 529 MG/ML IV SOLN
15.0000 mL | Freq: Once | INTRAVENOUS | Status: AC | PRN
  Administered 2022-04-10: 15 mL via INTRAVENOUS

## 2022-05-01 ENCOUNTER — Other Ambulatory Visit: Payer: Self-pay | Admitting: Radiation Therapy

## 2022-05-01 DIAGNOSIS — D329 Benign neoplasm of meninges, unspecified: Secondary | ICD-10-CM

## 2022-05-02 ENCOUNTER — Telehealth: Payer: Self-pay | Admitting: Radiation Therapy

## 2022-05-02 NOTE — Telephone Encounter (Signed)
I received a call back from Deborah Archer regarding her upcoming consult with Dr. Tammi Klippel. She has asked that the consult be delayed till 8/10 and that we not schedule the planning MRI or other appointments until she feels she is ready to move forward with treatment. 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 She is not sure when she would like to have treatment but is interested in meeting with Dr. Tammi Klippel to discuss her options.   Mont Dutton R.T.(R)(T) Radiation Special Procedures Navigator

## 2022-05-05 ENCOUNTER — Inpatient Hospital Stay: Payer: Medicare PPO | Attending: Radiation Oncology

## 2022-05-07 ENCOUNTER — Other Ambulatory Visit: Payer: Medicare PPO

## 2022-05-08 ENCOUNTER — Ambulatory Visit: Payer: Medicare PPO | Admitting: Radiation Oncology

## 2022-05-08 ENCOUNTER — Ambulatory Visit: Payer: Medicare PPO

## 2022-05-14 NOTE — Progress Notes (Signed)
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 Annette Stable MR Brain with/without Contrast CLINICAL DATA:  Meningioma for 3 years  FINDINGS: Brain: Dural based plaque-like enhancement at the level of the left prepontine cistern, consistent with sessile meningioma. The meningioma is indistinguishable from adjacent retro clival venous plexus but measures up to 7 mm in thickness and extends into the left Meckel's cave and anterior aspect of the left porous acousticus. The mass encases the left trigeminal nerve and contacts the left ventral pons.   No evidence of new mass, infarct, hemorrhage, hydrocephalus, or collection.   Vascular: Major flow voids and vascular enhancements are preserved.   Skull and upper cervical spine: Normal marrow signal.   Sinuses/Orbits: Negative   IMPRESSION: Unchanged sessile meningioma bulging into the left prepontine cistern and extending into the left Meckel's cave with trigeminal nerve impingement. Mild infiltration of the anterior left internal auditory canal is noted with possible minimal progression at this level when compared to prior. Overall little change since 2019.   06/12/2018 Dr. Darl Householder CT Head with/without Contrast CLINICAL DATA:  Vertigo and difficulty walking. History of meningioma.  FINDINGS: Brain: No hemorrhage or extra-axial collection. The sessile meningioma adjacent to the left cavernous sinus and extending into the left ambient cistern is unchanged in size, measuring 2.2 x 0.5 cm on sagittal images. The size and configuration of the ventricles and extra-axial CSF spaces are normal. There is no acute or chronic infarction. There is mild hypoattenuation of the periventricular white matter, most commonly indicating chronic ischemic microangiopathy.   Vascular: No abnormal hyperdensity of the major intracranial arteries or dural venous sinuses. No intracranial atherosclerosis.   Skull: The visualized skull base,  calvarium and extracranial soft tissues are normal.   Sinuses/Orbits: No fluid levels or advanced mucosal thickening of the visualized paranasal sinuses. No mastoid or middle ear effusion.  The orbits are normal.   IMPRESSION: 1. Unchanged appearance of sessile meningioma adjacent to the left cavernous sinus. 2. No acute abnormality. 3. Mild periventricular hypoattenuation suggesting mild chronic small vessel disease.  02/25/2018 Dr. Annette Stable MR Brain with/without Contrast CLINICAL DATA:  Continued surveillance of meningioma.  FINDINGS: Brain: Enhancing extra-axial lesion along the clivus extending to the LEFT towards the IAC, cross-section of 23 x 7 mm compares favorably with priors, some extension toward, and likely into, LEFT cavernous sinus, consistent with a sessile meningioma. Unchanged normal for age cerebral volume. Mild subcortical and periventricular T2 and FLAIR hyperintensities, likely chronic microvascular ischemic change.   No other areas of postcontrast enhancement.   Vascular: Normal flow voids.   Skull and upper cervical spine: Normal marrow signal.   Sinuses/Orbits: Negative.   Other: Incidental note is made of an artifact of the patient's scalp. Technologist researched this, and after discussing with patient, finding that she uses a hair thickening agent that contains titanium. Patient was instructed to wash the hair prior to next scan.   IMPRESSION: Stable sessile meningioma, along the LEFT ambient prepontine cistern region. No change from November 2018.   08/25/2017 Dr. Annamaria Boots MR Brain with/without Contrast CLINICAL DATA:  Left sixth nerve palsy.   FINDINGS: Brain: The midline structures are normal. There is no acute infarct or acute hemorrhage. There is a sessile, dural-based, contrast-enhancing lesion of the left ambient and prepontine cisterns that measures approximately 2.5 x 0.7 cm. This is at the site of Dorello's canal, where the left sixth cranial nerve  enters the skull base and cavernous sinus. Given the asymmetry of the cavernous sinuses,  there is probably some extension of the mass into the left cavernous sinus, but assessment is somewhat limited by artifacts on the coronal  post-contrast sequence. The cavernous internal carotid arteries remain patent and are not narrowed. There is mild multifocal white matter hyperintensity, possibly indicating chronic ischemic microangiopathy. No age-advanced or lobar predominant atrophy. No chronic microhemorrhage or superficial siderosis.   Vascular: Major intracranial arterial and venous sinus flow voids are preserved.   Skull and upper cervical spine: The visualized skull base, calvarium, upper cervical spine and extracranial soft tissues are normal.   Sinuses/Orbits: No fluid levels or advanced mucosal thickening. No mastoid or middle ear effusion. Normal orbits.   IMPRESSION: 1. Sessile meningioma along the dural surface of the left ambient and prepontine cisterns, at the site of Dorello's canal, where the left abducens nerve enters the skullbase and cavernous sinus. This is in keeping with the reported left CN VI palsy. 2. There is suspected extension of the meningioma into the left cavernous sinus, but the left internal carotid artery cavernous segment flow void remains normal. 3. Mild sequelae of chronic ischemic microangiopathy without acute abnormality.  Past or anticipated interventions, if any, per neurosurgery: NA  Past or anticipated interventions, if any, per medical oncology: NA  Dose of Decadron, if applicable: No  Recent neurologic symptoms, if any:  Seizures: {:18581} Headaches: {:18581} Nausea: {:18581} Dizziness/ataxia: {:18581} Difficulty with hand coordination: {:18581} Focal numbness/weakness: {:18581} Visual deficits/changes: {:18581} Confusion/Memory deficits: {:18581}  Painful bone metastases at present, if any: {:18581}  SAFETY ISSUES: Prior radiation?  {:18581} Pacemaker/ICD? {:18581} Possible current pregnancy? Postmenopausal Is the patient on methotrexate? No  Additional Complaints / other details:

## 2022-05-15 ENCOUNTER — Encounter: Payer: Self-pay | Admitting: Radiation Therapy

## 2022-05-15 ENCOUNTER — Ambulatory Visit
Admission: RE | Admit: 2022-05-15 | Discharge: 2022-05-15 | Disposition: A | Discharge status: Home / Self Care | Payer: Medicare PPO | Source: Ambulatory Visit | Attending: Radiation Oncology | Admitting: Radiation Oncology

## 2022-05-15 ENCOUNTER — Other Ambulatory Visit: Payer: Self-pay

## 2022-05-15 VITALS — BP 134/78 | HR 75 | Temp 97.7°F | Resp 20 | Ht 62.0 in | Wt 178.0 lb

## 2022-05-15 DIAGNOSIS — K219 Gastro-esophageal reflux disease without esophagitis: Secondary | ICD-10-CM | POA: Diagnosis not present

## 2022-05-15 DIAGNOSIS — R011 Cardiac murmur, unspecified: Secondary | ICD-10-CM | POA: Insufficient documentation

## 2022-05-15 DIAGNOSIS — D320 Benign neoplasm of cerebral meninges: Secondary | ICD-10-CM | POA: Diagnosis not present

## 2022-05-15 DIAGNOSIS — M129 Arthropathy, unspecified: Secondary | ICD-10-CM | POA: Diagnosis not present

## 2022-05-15 DIAGNOSIS — Q211 Atrial septal defect, unspecified: Secondary | ICD-10-CM | POA: Diagnosis not present

## 2022-05-15 DIAGNOSIS — E785 Hyperlipidemia, unspecified: Secondary | ICD-10-CM | POA: Diagnosis not present

## 2022-05-15 DIAGNOSIS — D329 Benign neoplasm of meninges, unspecified: Secondary | ICD-10-CM

## 2022-05-15 DIAGNOSIS — Z808 Family history of malignant neoplasm of other organs or systems: Secondary | ICD-10-CM | POA: Insufficient documentation

## 2022-05-15 NOTE — Progress Notes (Signed)
Deborah Archer came in to meet with Dr. Tammi Klippel and discuss radiation treatment options. She is interested in having SRS to treat the meningioma but has asked that we hold off until after she has completed the estate closure from her husband's death. I will reach back out to her in early October to see if she is ready to do anything then.   Mont Dutton R.T.(R)(T) Radiation Special Procedures Navigator

## 2022-05-15 NOTE — Progress Notes (Signed)
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    ICD-10-CM   1. Meningioma (HCC)  D32.9       HISTORY OF PRESENT ILLNESS: Deborah Archer is a 71 y.o. female seen at the request of Dr. Annette Stable.  She has been followed by Dr. 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 The meningioma has remained stable in size over the years but on her most recent MRI brain scan 04/10/22, it showed a slightly enlarged appearance of the petrous apex meningioma that appears to significantly displace the left 6th cranial nerve. The dural enhancement extends into the proximal internal auditory meatus but overall, minimal change since 2019. She has reviewed the radiographic imaging with her neurosurgeon and has been kindly referred today to discuss the potential role of stereotactic radiotherapy to slow the growth of the meningioma.  PREVIOUS RADIATION THERAPY: No  PAST MEDICAL HISTORY:  Past Medical History:  Diagnosis Date   Allergy    seasonal allergies   Arthritis    bilateral knees and hips   Chronic interstitial cystitis 2011   Dr Alona Bene with [facility] follows   Esotropia    GERD (gastroesophageal reflux disease)    OTC meds   Heart murmur    congenital heart murmur   Hyperlipidemia    diet controlled (11/23/2020)   Neuromuscular disorder ([place])    Residual ASD (atrial septal defect) following repair    surgical repair by Dr. Doren Custard in [place]: Past Surgical History:  Procedure Laterality Date   ASD REPAIR  1986   open heart repair   CARPAL TUNNEL RELEASE Right 2009   Dr. Trenton Gammon    CERVICAL SPINE SURGERY  2002   diskectomy, Dr. Trenton Gammon   EYE SURGERY Bilateral 2010, 2015   VI nerve paresis   STRABISMUS SURGERY Left 10/23/2017   Procedure: STRABISMUS REPAIR LEFT EYE;  Surgeon: Everitt Amber, MD;  Location: [facility];  Service: Ophthalmology;  Laterality: Left;   VENTRAL HERNIA REPAIR N/A 04/12/2020   Procedure: LAPAROSCOPIC VENTRAL WALL HERNIA REPAIR WITH MESH;  Surgeon: Michael Boston, MD;  Location: [facility];  Service: General;  Laterality: N/A;    FAMILY HISTORY:  Family History  Problem Relation Age of Onset   Brain cancer Mother 22   Heart disease Father        heart attack   Stroke Maternal Grandmother    Stroke Maternal Grandfather    Cancer Paternal Grandmother        Breast   Heart attack Paternal Grandfather    Colon polyps Neg Hx    Colon cancer Neg Hx    Esophageal cancer Neg Hx    Stomach cancer Neg Hx    Rectal cancer Neg Hx     SOCIAL HISTORY:  Social History   Socioeconomic History   Marital status: Married    Spouse name: Maylon Cos   Number of children: 1   Years of education: College   Highest education level: Not on file  Occupational History   Occupation: retired Pharmacist, hospital    Comment: 2004  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 Her daughter lives nearby, and requires assistance due to relapse of Lyme Disease, undergoing IV treatment with a physician in [place], [place].   Social Determinants of Health   Financial Resource Strain: Not on file  Food Insecurity: Not on file  Transportation  Needs: Not on file  Physical Activity: Not on file  Stress: Not on file  Social Connections: Not on file  Intimate Partner Violence: Not on file    ALLERGIES: Amoxicillin, Clavulanic acid, Nsaids, and Latex  MEDICATIONS:  Current Outpatient Medications  Medication Sig Dispense Refill   acetaminophen (TYLENOL) 500 MG tablet Take 500-1,000 mg by mouth every 8 (eight) hours as needed (pain).     alum & mag hydroxide-simeth (GELUSIL) 200-200-25 MG chewable tablet Chew 1-2 tablets by mouth every 6 (six) hours as needed for indigestion or heartburn.     bismuth subsalicylate (PEPTO BISMOL) 262 MG chewable tablet Chew 524 mg by mouth 4 (four) times daily as needed for indigestion or diarrhea or loose stools.     Calcium Glycerophosphate (PRELIEF PO) Take 1-2 tablets by mouth with breakfast, with lunch, and with evening meal.     Carboxymethylcellulose Sod PF (REFRESH PLUS) 0.5 % SOLN Place 1 drop into both eyes at bedtime.     Digestive Enzymes (DIGESTIVE ENZYME PO) Take 1-2 capsules by mouth with breakfast, with lunch, and with evening meal. Jeannetta Nap Digest Assure     lansoprazole (PREVACID) 15 MG capsule Take 15 mg by mouth at bedtime. OTC     No current facility-administered medications for this encounter.    REVIEW OF SYSTEMS:  On review of systems, the patient reports that she is doing well overall. She has not had any recent changes in her visual or auditory acuity though she has started to notice that the left eye is starting to lag in tracking to the left but not severe. She denies any numbness or weakness in the face/neck. She denies any chest pain, shortness of breath, cough, fevers, chills, night sweats, unintended weight changes. She denies any bowel or bladder disturbances, and denies abdominal pain, nausea or vomiting. She denies any new musculoskeletal or joint aches or pains. A complete review of systems is obtained and is otherwise negative.    PHYSICAL EXAM:  Wt Readings  from Last 3 Encounters:  12/04/20 172 lb (78 kg)  11/23/20 172 lb 9.6 oz (78.3 kg)  04/13/20 173 lb 8 oz (78.7 kg)   Temp Readings from Last 3 Encounters:  12/04/20 98.2 F (36.8 C)  04/13/20 98.3 F (36.8 C) (Oral)  04/05/20 98.1 F (36.7 C) (Oral)   BP Readings from Last 3 Encounters:  12/04/20 138/60  04/13/20 121/64  04/05/20 130/66   Pulse Readings from Last 3 Encounters:  12/04/20 63  04/13/20 67  04/05/20 76    /10  In general this is a well appearing Caucasian female in no acute distress.  She's alert and oriented x4 and appropriate throughout the examination.  Cardiopulmonary assessment is negative for acute distress and she exhibits normal effort.    KPS = 100  100 - Normal; no complaints; no evidence of disease. 90   - Able to carry on normal activity; minor signs or symptoms of disease. 80   - Normal activity with effort; some signs or symptoms of disease. 22   - Cares for self; unable to carry on normal activity or to do active work. 60   - Requires occasional assistance, but is able to care for most of his personal needs. 50   - Requires considerable assistance and frequent medical care. 81   - Disabled; requires special care and assistance. 5   - Severely disabled; hospital admission is indicated although death not imminent. 73   - Very sick; hospital admission necessary; active supportive treatment necessary. 10   - Moribund; fatal processes progressing rapidly. 0     - Dead  Karnofsky DA, Abelmann [place], Craver LS and Burchenal [facility] [phone]) The use of the nitrogen mustards in the palliative treatment of carcinoma: with particular reference to bronchogenic carcinoma Cancer 1 634-56  LABORATORY DATA:  Lab Results  Component Value Date   WBC 7.3 04/05/2020   HGB 12.2 04/05/2020   HCT 38.4 04/05/2020   MCV 86.1 04/05/2020   PLT 303 04/05/2020   Lab Results  Component Value Date   NA 139 04/05/2020   K 4.0 04/05/2020   CL 105 04/05/2020   CO2 27  04/05/2020   Lab Results  Component Value Date   ALT 24 06/12/2018   AST 33 06/12/2018   ALKPHOS 104 06/12/2018   BILITOT 0.7 06/12/2018     RADIOGRAPHY: No results found.    IMPRESSION/PLAN: 1. 71 y.o. female with left petrous apex meningioma. At this point, the patient would potentially benefit from stereotactic radiosurgery. We discussed the pros and cons of treatment as well as the logistics. We reviewed the results associated SRS being about 95% liklihood for shrinking the meningioma and certainly slowing, if not halting its progression. We discussed that stereotactic radiosurgery does carry some risks, including a small risk of radionecrosis or damage to nearby CNs. However, if left untreated, this will almost certainly progress to involve impairment of other CNs such as the auditory and facial nerves.We discussed the recommendation for a single fraction treatment to the meningioma and the side effects associated with treatment The patient seems to understand the treatment options and would like to proceed with stereotactic radiosurgery but prefers to delay treatment until closer to the end of the year as she is still trying to get her late husband's estate in order since his passing in Dec. 2022. She has our contact information and will call Manuela Schwartz when she is ready to proceed. She knows that she is welcome to call with any questions or concerns in the interim.  We personally spent 70 minutes in this encounter including chart review, reviewing radiological studies, meeting face-to-face with the patient, entering orders and completing documentation.   Nicholos Johns, PA-C    Tyler Pita, MD  [facility] Direct Dial: [phone]  Fax: [phone] [email or web address]  Skype  LinkedIn

## 2022-07-11 ENCOUNTER — Telehealth: Payer: Self-pay | Admitting: Radiation Therapy

## 2022-07-11 NOTE — Telephone Encounter (Signed)
Left a vm requesting a call back to see if Deborah Archer is ready to move forward with radiosurgery treatment planning for her meningioma by Dr. Tammi Klippel and Dr. Annette Stable.   Mont Dutton R.T.(R)(T) Radiation Special Procedures Navigator

## 2022-07-16 ENCOUNTER — Telehealth: Payer: Self-pay | Admitting: Radiation Therapy

## 2022-07-16 NOTE — Telephone Encounter (Signed)
Left a voicemail requesting a call back to see if pt is ready to move forward with radiation treatment planning for her meningioma.   Mont Dutton R.T.(R)(T) Radiation Special Procedures Navigator

## 2022-07-25 ENCOUNTER — Telehealth: Payer: Self-pay

## 2022-07-25 NOTE — Telephone Encounter (Signed)
She called regarding starting her treatments. She says she is running into more "snags" and is anticipating the beginning of December to start her treatments. I advised her that you would be giving her a call back. She is very, very talkative and is having anxiety about her social challenges. She is looking forward to touching base with you about this

## 2022-08-13 ENCOUNTER — Telehealth: Payer: Self-pay | Admitting: Radiation Therapy

## 2022-08-13 NOTE — Telephone Encounter (Signed)
Called to check in with pt to see if she is ready to move forward with radiation treatment planing with Dr. Tammi Klippel and Dr. Annette Stable to treat her meningioma. There was no answer, so I left a detailed message including my contact information requesting a call back to let us know how and when she would like to proceed.   Mont Dutton R.T.(R)(T) Radiation Special Procedures Navigator

## 2022-09-03 ENCOUNTER — Telehealth: Payer: Self-pay | Admitting: Radiation Therapy

## 2022-09-03 NOTE — Telephone Encounter (Signed)
I called Deborah Archer to check in and see if she is ready to move forward with getting her treatment set up. She is still dealing with the closing the estate from her husband's death and has asked that we delay until after the end of the year. She asked that I call back 10/13/22 to see if she is ready to move forward at that time. If she gets things in order before then, she will give me a call.   Mont Dutton R.T.(R)(T) Radiation Special Procedures Navigator

## 2022-10-27 ENCOUNTER — Telehealth: Payer: Self-pay | Admitting: Radiation Therapy

## 2022-10-27 NOTE — Telephone Encounter (Signed)
Left a voicemail for pt requesting a call back.  Mont Dutton R.T(R)(T) Radiation Special Procedures Navigator

## 2022-11-05 ENCOUNTER — Telehealth: Payer: Self-pay | Admitting: Radiation Therapy

## 2022-11-05 NOTE — Telephone Encounter (Signed)
Pt called me back to say she is almost ready to move forward with treatment planning. The estate for her husband has been closed but  she wants to complete the trust fund for her daughter before starting appointments for radiation. She will call back towards the end of February or beginning of March hoping she will be ready then.  Mont Dutton R.T.(R)(T) Radiation Special Procedures Navigator

## 2023-01-23 ENCOUNTER — Other Ambulatory Visit: Payer: Self-pay | Admitting: Radiation Therapy

## 2023-01-23 DIAGNOSIS — D329 Benign neoplasm of meninges, unspecified: Secondary | ICD-10-CM

## 2023-01-29 ENCOUNTER — Other Ambulatory Visit: Payer: Self-pay

## 2023-01-29 ENCOUNTER — Ambulatory Visit
Admission: RE | Admit: 2023-01-29 | Discharge: 2023-01-29 | Disposition: A | Discharge status: Home / Self Care | Payer: Medicare PPO | Source: Ambulatory Visit | Attending: Radiation Oncology | Admitting: Radiation Oncology

## 2023-01-29 DIAGNOSIS — D329 Benign neoplasm of meninges, unspecified: Secondary | ICD-10-CM

## 2023-01-29 DIAGNOSIS — D320 Benign neoplasm of cerebral meninges: Secondary | ICD-10-CM | POA: Insufficient documentation

## 2023-01-29 LAB — BUN & CREATININE (CHCC)
BUN: 14 mg/dL (ref 8–23)
Creatinine: 0.7 mg/dL (ref 0.44–1.00)
GFR, Estimated: >60 mL/min (ref >60)

## 2023-01-29 MED ORDER — GADOPICLENOL 0.5 MMOL/ML IV SOLN
8.0000 mL | Freq: Once | INTRAVENOUS | Status: AC | PRN
  Administered 2023-01-29: 8 mL via INTRAVENOUS

## 2023-02-03 ENCOUNTER — Ambulatory Visit
Admission: RE | Admit: 2023-02-03 | Discharge: 2023-02-03 | Disposition: A | Discharge status: Home / Self Care | Payer: Medicare PPO | Source: Ambulatory Visit | Attending: Urology | Admitting: Urology

## 2023-02-03 ENCOUNTER — Encounter: Payer: Self-pay | Admitting: Urology

## 2023-02-03 ENCOUNTER — Ambulatory Visit
Admission: RE | Admit: 2023-02-03 | Discharge: 2023-02-03 | Disposition: A | Discharge status: Home / Self Care | Payer: Medicare PPO | Source: Ambulatory Visit | Attending: Radiation Oncology | Admitting: Radiation Oncology

## 2023-02-03 ENCOUNTER — Ambulatory Visit
Admission: RE | Admit: 2023-02-03 | Discharge: 2023-02-03 | Disposition: A | Discharge status: Home / Self Care | Payer: Medicare PPO | Source: Ambulatory Visit | Attending: Radiation Oncology

## 2023-02-03 VITALS — BP 126/86 | HR 79 | Temp 96.9°F | Resp 18 | Ht 62.5 in | Wt 179.0 lb

## 2023-02-03 DIAGNOSIS — Z803 Family history of malignant neoplasm of breast: Secondary | ICD-10-CM | POA: Insufficient documentation

## 2023-02-03 DIAGNOSIS — D329 Benign neoplasm of meninges, unspecified: Secondary | ICD-10-CM

## 2023-02-03 DIAGNOSIS — E785 Hyperlipidemia, unspecified: Secondary | ICD-10-CM | POA: Diagnosis not present

## 2023-02-03 DIAGNOSIS — D320 Benign neoplasm of cerebral meninges: Secondary | ICD-10-CM | POA: Diagnosis not present

## 2023-02-03 DIAGNOSIS — R011 Cardiac murmur, unspecified: Secondary | ICD-10-CM | POA: Diagnosis not present

## 2023-02-03 DIAGNOSIS — N3010 Interstitial cystitis (chronic) without hematuria: Secondary | ICD-10-CM | POA: Diagnosis not present

## 2023-02-03 DIAGNOSIS — M129 Arthropathy, unspecified: Secondary | ICD-10-CM | POA: Insufficient documentation

## 2023-02-03 DIAGNOSIS — Z800 Family history of malignant neoplasm of digestive organs: Secondary | ICD-10-CM | POA: Diagnosis not present

## 2023-02-03 DIAGNOSIS — K219 Gastro-esophageal reflux disease without esophagitis: Secondary | ICD-10-CM | POA: Insufficient documentation

## 2023-02-03 MED ORDER — SODIUM CHLORIDE 0.9% FLUSH
10.0000 mL | INTRAVENOUS | Status: DC | PRN
  Administered 2023-02-03: 10 mL via INTRAVENOUS

## 2023-02-03 NOTE — Progress Notes (Signed)
Has armband been applied?  Yes.    Does patient have an allergy to IV contrast dye? Patient states "NO"   Has patient ever received premedication for IV contrast dye?: No.   Does patient take metformin?: No.  If patient does take metformin when was the last dose: N/A  Date of lab work: January 29, 2023 BUN: 14 CR: 0.70 eGfr: >60  IV site: antecubital left, condition patent and no redness  Has IV site been added to flowsheet?  Yes.    Vitals- BP 126/86 (BP Location: Left Arm, Patient Position: Sitting, Cuff Size: Normal)  Pulse 79  Temp (!) 96.9 F (36.1 C) (Temporal)  Resp 18  Ht 5' 2.5" (1.588 m)  Wt 179 lb (81.2 kg)  SpO2 96%  BMI 32.22 kg/m   This concludes the interaction.   Ruel Favors, LPN

## 2023-02-03 NOTE — Progress Notes (Signed)
Nursing interview for a 72 yr old female w/ Benign Neoplasm of the Meninges. Patient identity verified x2.  Patient reports moderate visual changes, but related no discomfort at this time.  Meaningful use complete. Postmenopausal  BP 126/86 (BP Location: Left Arm, Patient Position: Sitting, Cuff Size: Normal)   Pulse 79   Temp (!) 96.9 F (36.1 C) (Temporal)   Resp 18   Ht 5' 2.5" (1.588 m)   Wt 179 lb (81.2 kg)   SpO2 96%   BMI 32.22 kg/m   This concludes the interview.   Ruel Favors, LPN

## 2023-02-03 NOTE — Progress Notes (Signed)
  Radiation Oncology         (336) [phone] ________________________________  Name: Deborah Archer MRN: [id number]  Date: 02/03/2023  DOB: September 02, 1951  SIMULATION AND TREATMENT PLANNING NOTE    ICD-10-CM   1. Benign neoplasm of cerebral meninges (HCC)  D32.0       DIAGNOSIS:  72 yo woman with left petrous apex meningioma.  NARRATIVE:  The patient was brought to the CT Simulation planning suite.  Identity was confirmed.  All relevant records and images related to the planned course of therapy were reviewed.  The patient freely provided informed written consent to proceed with treatment after reviewing the details related to the planned course of therapy. The consent form was witnessed and verified by the simulation staff. Intravenous access was established for contrast administration. Then, the patient was set-up in a stable reproducible supine position for radiation therapy.  A relocatable thermoplastic stereotactic head frame was fabricated for precise immobilization.  CT images were obtained.  Surface markings were placed.  The CT images were loaded into the planning software and fused with the patient's targeting MRI scan.  Then the target and avoidance structures were contoured.  Treatment planning then occurred.  The radiation prescription was entered and confirmed.  I have requested 3D planning  I have requested a DVH of the following structures: Brain stem, brain, left eye, right eye, lenses, optic chiasm, target volumes, uninvolved brain, and normal tissue.    SPECIAL TREATMENT PROCEDURE:  The planned course of therapy using radiation constitutes a special treatment procedure. Special care is required in the management of this patient for the following reasons. This treatment constitutes a Special Treatment Procedure for the following reason: High dose per fraction requiring special monitoring for increased toxicities of treatment including daily imaging.  The special nature of the planned  course of radiotherapy will require increased physician supervision and oversight to ensure patient's safety with optimal treatment outcomes.  This requires extended time and effort.  PLAN:  The patient will receive 25 Gy in 5 fractions.  ________________________________  Artist Pais Kathrynn Running, M.D.

## 2023-02-03 NOTE — Progress Notes (Signed)
Radiation Oncology         [phone] ________________________________  Follow Up New  Name: Deborah Archer MRN: [id number]  Date of Service: 02/03/2023 DOB: 03-Mar-1951  CC:Courtney Paris, NP  Courtney Paris, NP   REFERRING PHYSICIAN: Courtney Paris, NP  DIAGNOSIS: 72 yo woman with left petrous apex meningioma.    ICD-10-CM   1. Meningioma (HCC)  D32.9     2. Benign neoplasm of meninges (HCC)  D32.9       HISTORY OF PRESENT ILLNESS: Deborah Archer is a 72 y.o. female seen at the request of Dr. Jordan Likes.  She has been followed by Dr. 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 The meningioma has remained stable in size over the years but on a recent MRI brain scan 04/10/22, it showed a slightly enlarged appearance of the petrous apex meningioma that appeared to significantly displace the left 6th cranial nerve. The dural enhancement extends into the proximal internal auditory meatus but overall, minimal change since 2019.  We met with the patient in 05/15/22 to discuss SRS as treatment for the meningioma. At that time, the patient was in the process of closing her late husband's estate and requested to delay starting treatment. More recently, she underwent a repeat treatment planning brain MRI on 01/29/23 showing an unchanged appearance of the sessile left petroclival meningioma with extension into the left porus acusticus and left Meckel's cave with encasement of the left trigeminal nerve and left abducens nerve, and involvement of the posterior aspect of the left cavernous sinus.  She has reviewed the radiographic imaging with her neurosurgeon and returns today for further discussion regarding proceeding with the recommended stereotactic radiotherapy to halt the growth of the meningioma.  PREVIOUS RADIATION THERAPY: No  PAST MEDICAL HISTORY:  Past Medical History:  Diagnosis Date    Allergy    seasonal allergies   Arthritis    bilateral knees and hips   Chronic interstitial cystitis 2011   Dr Marcelyn Bruins with [facility] follows   Esotropia    GERD (gastroesophageal reflux disease)    OTC meds   Heart murmur    congenital heart murmur   Hyperlipidemia    diet controlled (11/23/2020)   Neuromuscular disorder (HCC)    Residual ASD (atrial septal defect) following repair    surgical repair by Dr. Durwin Nora in 1987      PAST SURGICAL HISTORY: Past Surgical History:  Procedure Laterality Date   ASD REPAIR  1986   open heart repair   CARPAL TUNNEL RELEASE Right 2009   Dr. Dutch Quint   CERVICAL SPINE SURGERY  2002   diskectomy, Dr. Dutch Quint   EYE SURGERY Bilateral 2010, 2015   VI nerve paresis   STRABISMUS SURGERY Left 10/23/2017   Procedure: STRABISMUS REPAIR LEFT EYE;  Surgeon: Verne Carrow, MD;  Location: [facility];  Service: Ophthalmology;  Laterality: Left;   VENTRAL HERNIA REPAIR N/A 04/12/2020   Procedure: LAPAROSCOPIC VENTRAL WALL HERNIA REPAIR WITH MESH;  Surgeon: Karie Soda, MD;  Location: [facility];  Service: General;  Laterality: N/A;    FAMILY HISTORY:  Family History  Problem Relation Age of Onset   Brain cancer Mother 25   Heart disease Father        heart attack   Stroke Maternal Grandmother    Stroke Maternal Grandfather    Cancer Paternal Grandmother  Breast   Heart attack Paternal Grandfather    Colon polyps Neg Hx    Colon cancer Neg Hx    Esophageal cancer Neg Hx    Stomach cancer Neg Hx    Rectal cancer Neg Hx     SOCIAL HISTORY:  Social History   Socioeconomic History   Marital status: Married    Spouse name: Leta Jungling   Number of children: 1   Years of education: College   Highest education level: Not on file  Occupational History   Occupation: retired Runner, broadcasting/film/video    Comment: 2004   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 Her daughter lives nearby, and requires assistance due to relapse of Lyme Disease, undergoing IV treatment with a physician in [place], [place].   Social Determinants of Health   Financial Resource Strain: Not on file  Food Insecurity: No Food Insecurity (02/03/2023)   Hunger Vital Sign    Worried About Running Out of Food in the Last Year: Never true    Ran Out of Food in the Last Year: Never true  Transportation Needs: No Transportation Needs (02/03/2023)   PRAPARE - Administrator, Civil Service (Medical): No    Lack of Transportation (Non-Medical): No  Physical Activity: Not on file  Stress: Not on file  Social Connections: Not on file  Intimate Partner Violence: Not At Risk (02/03/2023)   Humiliation, Afraid, Rape, and Kick questionnaire    Fear of Current or Ex-Partner: No    Emotionally Abused: No    Physically Abused: No    Sexually Abused: No    ALLERGIES: Amoxicillin, Clavulanic acid, Nsaids, and Latex  MEDICATIONS:  Current Outpatient Medications  Medication Sig Dispense Refill   acetaminophen (TYLENOL) 500 MG tablet Take 500-1,000 mg by mouth every 8 (eight) hours as needed (pain).     alum & mag hydroxide-simeth (GELUSIL) 200-200-25 MG chewable tablet Chew 1-2 tablets by mouth every 6 (six) hours as needed for indigestion or heartburn.     bismuth subsalicylate (PEPTO BISMOL) 262 MG chewable tablet Chew 524 mg by mouth 4 (four) times daily as needed for indigestion or diarrhea or loose stools.     Calcium Glycerophosphate (PRELIEF PO) Take 1-2 tablets by mouth with breakfast, with lunch, and with evening meal.      Carboxymethylcellulose Sod PF (REFRESH PLUS) 0.5 % SOLN Place 1 drop into both eyes at bedtime.     cyclobenzaprine (FLEXERIL) 10 MG tablet      Digestive Enzymes (DIGESTIVE ENZYME PO) Take 1-2 capsules by mouth with breakfast, with lunch, and with evening meal. Johnathan Hausen Digest Assure     lansoprazole (PREVACID) 15 MG capsule Take 15 mg by mouth at bedtime. OTC     pravastatin (PRAVACHOL) 40 MG tablet      No current facility-administered medications for this encounter.    REVIEW OF SYSTEMS:  On review of systems, the patient reports that she is doing well overall. She has not had any recent changes  in her visual or auditory acuity though she has started to notice that the left eye is starting to lag in tracking to the left but not severe. She denies any numbness or weakness in the face/neck. She denies any chest pain, shortness of breath, cough, fevers, chills, night sweats, unintended weight changes. She denies any bowel or bladder disturbances, and denies abdominal pain, nausea or vomiting. She denies any new musculoskeletal or joint aches or pains. A complete review of systems is obtained and is otherwise negative.    PHYSICAL EXAM:  Wt Readings from Last 3 Encounters:  02/03/23 179 lb (81.2 kg)  05/15/22 178 lb (80.7 kg)  12/04/20 172 lb (78 kg)   Temp Readings from Last 3 Encounters:  02/03/23 (!) 96.9 F (36.1 C) (Temporal)  05/15/22 97.7 F (36.5 C)  12/04/20 98.2 F (36.8 C)   BP Readings from Last 3 Encounters:  02/03/23 126/86  05/15/22 134/78  12/04/20 138/60   Pulse Readings from Last 3 Encounters:  02/03/23 79  05/15/22 75  12/04/20 63   Pain Assessment Pain Score: 0-No pain/10  In general this is a well appearing Caucasian female in no acute distress.  She's alert and oriented x4 and appropriate throughout the examination. Cardiopulmonary assessment is negative for acute distress and she exhibits normal effort.    KPS = 100  100 - Normal; no  complaints; no evidence of disease. 90   - Able to carry on normal activity; minor signs or symptoms of disease. 80   - Normal activity with effort; some signs or symptoms of disease. 79   - Cares for self; unable to carry on normal activity or to do active work. 60   - Requires occasional assistance, but is able to care for most of his personal needs. 50   - Requires considerable assistance and frequent medical care. 40   - Disabled; requires special care and assistance. 30   - Severely disabled; hospital admission is indicated although death not imminent. 20   - Very sick; hospital admission necessary; active supportive treatment necessary. 10   - Moribund; fatal processes progressing rapidly. 0     - Dead  Karnofsky DA, Abelmann WH, Craver LS and Burchenal [facility] [phone]) The use of the nitrogen mustards in the palliative treatment of carcinoma: with particular reference to bronchogenic carcinoma Cancer 1 634-56  LABORATORY DATA:  Lab Results  Component Value Date   WBC 7.3 04/05/2020   HGB 12.2 04/05/2020   HCT 38.4 04/05/2020   MCV 86.1 04/05/2020   PLT 303 04/05/2020   Lab Results  Component Value Date   NA 139 04/05/2020   K 4.0 04/05/2020   CL 105 04/05/2020   CO2 27 04/05/2020   Lab Results  Component Value Date   ALT 24 06/12/2018   AST 33 06/12/2018   ALKPHOS 104 06/12/2018   BILITOT 0.7 06/12/2018     RADIOGRAPHY: MR Brain W Wo Contrast  Result Date: 02/02/2023 CLINICAL DATA:  Meningioma 3T SRS Protocol for radiation treatment planning. EXAM: MRI HEAD WITHOUT AND WITH CONTRAST TECHNIQUE: Multiplanar, multiecho pulse sequences of the brain and surrounding structures were obtained without and with intravenous contrast. CONTRAST:  8 mL Vueway. COMPARISON:  MRI brain 04/10/2022 and older. FINDINGS: Brain: Unchanged sessile left petroclival meningioma measuring up to 7 mm in thickness (axial image 64 series 14) with extension into the left porus acusticus (image 59 series 14)  and left Meckel's cave, where it encases the left trigeminal nerve (image  67 series 14). It also encases the left abducens nerve (image 62 series 14) and involves the posterior aspect of the left cavernous sinus (coronal images 27-28 series 15). No acute infarct or hemorrhage. Mild chronic small-vessel disease. No hydrocephalus or midline shift. Vascular: Normal flow voids and enhancement. Skull and upper cervical spine: Normal marrow signal and enhancement. Sinuses/Orbits: Unremarkable. Other: None. IMPRESSION: Unchanged sessile left petroclival meningioma with extension into the left porus acusticus and left Meckel's cave, where it encases the left trigeminal nerve. It also encases the left abducens nerve and involves the posterior aspect of the left cavernous sinus. Electronically Signed   By: Orvan Falconer M.D.   On: 02/02/2023 13:50      IMPRESSION/PLAN: 1. 72 y.o. female with left petrous apex meningioma. At this point, the patient would potentially benefit from stereotactic radiosurgery. We discussed the pros and cons of treatment as well as the logistics. We reviewed the results associated SRS being about 95% liklihood for shrinking the meningioma and certainly slowing, if not halting its progression. We discussed that stereotactic radiosurgery does carry some risks, including a small risk of radionecrosis or damage to nearby CNs. However, if left untreated, this will almost certainly progress to involve impairment of other CNs such as the auditory and facial nerves. We discussed the recommendation for a 5 fraction course of SRS to the meningioma and the side effects associated with treatment The patient seems to understand the treatment options and would like to proceed with stereotactic radiosurgery.  She has freely signed written consent to proceed today in the office and a copy of this document will be placed in her medical record.  She is scheduled for CT simulation following our visit today, in  anticipation of beginning her treatments in the very near future.  We enjoyed meeting with her again today and she knows that she is welcome to call with any questions or concerns in the interim.  We personally spent 60 minutes in this encounter including chart review, reviewing radiological studies, meeting face-to-face with the patient, entering orders and completing documentation.   Marguarite Arbour, PA-C    Margaretmary Dys, MD  [facility]  Radiation Oncology Direct Dial: [phone]  Fax: [phone] [email or web address]  Skype  LinkedIn

## 2023-02-04 ENCOUNTER — Telehealth: Payer: Self-pay | Admitting: Radiation Therapy

## 2023-02-04 NOTE — Telephone Encounter (Signed)
I spoke with Deborah Archer about her upcoming SRS treatment appointments.   Jalene Mullet R.T.(R)(T) Radiation Special Procedures Navigator

## 2023-02-05 DIAGNOSIS — D320 Benign neoplasm of cerebral meninges: Secondary | ICD-10-CM | POA: Insufficient documentation

## 2023-02-10 ENCOUNTER — Other Ambulatory Visit: Payer: Self-pay

## 2023-02-10 ENCOUNTER — Ambulatory Visit
Admission: RE | Admit: 2023-02-10 | Discharge: 2023-02-10 | Disposition: A | Discharge status: Home / Self Care | Payer: Medicare PPO | Source: Ambulatory Visit | Attending: Radiation Oncology | Admitting: Radiation Oncology

## 2023-02-10 DIAGNOSIS — D320 Benign neoplasm of cerebral meninges: Secondary | ICD-10-CM | POA: Diagnosis not present

## 2023-02-10 LAB — RAD ONC ARIA SESSION SUMMARY
Course Elapsed Days: 0
Plan Fractions Treated to Date: 1
Plan Prescribed Dose Per Fraction: 5 Gy
Plan Total Fractions Prescribed: 5
Plan Total Prescribed Dose: 25 Gy
Reference Point Dosage Given to Date: 5 Gy
Reference Point Session Dosage Given: 5 Gy
Session Number: 1

## 2023-02-10 NOTE — Progress Notes (Addendum)
Patient to clinic for 15 minute observation SRS Brain.  Denies headache, cognitive changes, ringing in the ears, fatigue, nausea/vomiting,skin irritation,vision changes, and no gait issues.  Speech clear and audible.  No Decadron order.  Patient was advised not to do anything strenuous for the next 24 hours and to call [phone] if symptoms should develop.  Vitals: 98.2-78-20-141/76 O2 sat 99% room air.

## 2023-02-10 NOTE — Op Note (Signed)
  Name: Deborah Archer  MRN: [id number]  Date: 02/10/2023   DOB: 1951-05-26  Stereotactic Radiosurgery Operative Note  PRE-OPERATIVE DIAGNOSIS:  Left petrous apex meningioma  POST-OPERATIVE DIAGNOSIS:   same  PROCEDURE:  Stereotactic Radiosurgery, complex  SURGEON:  Temple Pacini, MD  NARRATIVE: The patient underwent a radiation treatment planning session in the radiation oncology simulation suite under the care of the radiation oncology physician and physicist.  I participated closely in the radiation treatment planning afterwards. The patient underwent planning CT which was fused to 3T high resolution MRI with 1 mm axial slices.  These images were fused on the planning system.  We contoured the gross target volumes and subsequently expanded this to yield the Planning Target Volume. I actively participated in the planning process.  I helped to define and review the target contours and also the contours of the optic pathway, eyes, brainstem and selected nearby organs at risk.  All the dose constraints for critical structures were reviewed and compared to AAPM Task Group 101.  The prescription dose conformity was reviewed.  I approved the plan electronically.    Accordingly, Deborah Archer was brought to the TrueBeam stereotactic radiation treatment linac and placed in the custom immobilization mask.  The patient was aligned according to the IR fiducial markers with BrainLab Exactrac, then orthogonal x-rays were used in ExacTrac with the 6DOF robotic table and the shifts were made to align the patient  Deborah Archer received stereotactic radiosurgery uneventfully.    The detailed description of the procedure is recorded in the radiation oncology procedure note.  I was present for the duration of the procedure.  DISPOSITION:  Following delivery, the patient was transported to nursing in stable condition and monitored for possible acute effects to be discharged to home in stable condition with  follow-up in one month.  Temple Pacini, MD 02/10/2023 1:58 PM

## 2023-02-12 ENCOUNTER — Other Ambulatory Visit: Payer: Self-pay

## 2023-02-12 ENCOUNTER — Ambulatory Visit
Admission: RE | Admit: 2023-02-12 | Discharge: 2023-02-12 | Disposition: A | Discharge status: Home / Self Care | Payer: Medicare PPO | Source: Ambulatory Visit | Attending: Radiation Oncology | Admitting: Radiation Oncology

## 2023-02-12 VITALS — BP 148/78 | HR 65 | Temp 97.8°F | Resp 18

## 2023-02-12 DIAGNOSIS — D320 Benign neoplasm of cerebral meninges: Secondary | ICD-10-CM

## 2023-02-12 LAB — RAD ONC ARIA SESSION SUMMARY
Course Elapsed Days: 2
Plan Fractions Treated to Date: 2
Plan Prescribed Dose Per Fraction: 5 Gy
Plan Total Fractions Prescribed: 5
Plan Total Prescribed Dose: 25 Gy
Reference Point Dosage Given to Date: 10 Gy
Reference Point Session Dosage Given: 5 Gy
Session Number: 2

## 2023-02-12 NOTE — Progress Notes (Signed)
  Radiation Oncology         [phone] ________________________________  Stereotactic Treatment Procedure Note  Name: Deborah Archer MRN: [id number]  Date: 02/10/2023  DOB: 07-08-1951  SPECIAL TREATMENT PROCEDURE    ICD-10-CM   1. Benign neoplasm of cerebral meninges (HCC)  D32.0       3D TREATMENT PLANNING AND DOSIMETRY:  The patient's radiation plan was reviewed and approved by neurosurgery and radiation oncology prior to treatment.  It showed 3-dimensional radiation distributions overlaid onto the planning CT/MRI image set.  The [facility] for the target structures as well as the organs at risk were reviewed. The documentation of the 3D plan and dosimetry are filed in the radiation oncology EMR.  NARRATIVE:  Deborah Archer was brought to the TrueBeam stereotactic radiation treatment machine and placed supine on the CT couch. The head frame was applied, and the patient was set up for stereotactic radiosurgery.  Neurosurgery was present for the set-up and delivery  SIMULATION VERIFICATION:  In the couch zero-angle position, the patient underwent Exactrac imaging using the Brainlab system with orthogonal KV images.  These were carefully aligned and repeated to confirm treatment position for each of the isocenters.  The Exactrac snap film verification was repeated at each couch angle.  PROCEDURE: Deborah Archer received stereotactic radiosurgery to the following targets: Left sphenoid wing meningioma 26 mm target was treated using 5 Rapid Arc VMAT Beams to a prescription dose of 5 Gy to be repeated for 5 fractions to a total of 25 Gy.  ExacTrac registration was performed for each couch angle.  The 100% isodose line was prescribed.  6 MV X-rays were delivered in the flattening filter free beam mode.  STEREOTACTIC TREATMENT MANAGEMENT:  Following delivery, the patient was transported to nursing in stable condition and monitored for possible acute effects.  Vital signs were recorded . The patient  tolerated treatment without significant acute effects, and was discharged to home in stable condition.    PLAN: Follow-up in one month.  ________________________________  Artist Pais. Kathrynn Running, M.D.

## 2023-02-12 NOTE — Progress Notes (Signed)
Patient to clinic for 15 minutes observation SRS Brain.  Denies headache, fatigue, ringing in ears, nausea/vomiting, skin irritation,visual changes.  Speech remains clear and audible.  Ambulating out of clinic without difficulty.  No Decadron orders.  Reminded to not do anything strenuous for 24 hours and to call if symptoms develop.

## 2023-02-16 ENCOUNTER — Ambulatory Visit
Admission: RE | Admit: 2023-02-16 | Discharge: 2023-02-16 | Disposition: A | Discharge status: Home / Self Care | Payer: Medicare PPO | Source: Ambulatory Visit | Attending: Radiation Oncology | Admitting: Radiation Oncology

## 2023-02-16 ENCOUNTER — Other Ambulatory Visit: Payer: Self-pay

## 2023-02-16 DIAGNOSIS — D320 Benign neoplasm of cerebral meninges: Secondary | ICD-10-CM

## 2023-02-16 LAB — RAD ONC ARIA SESSION SUMMARY
Course Elapsed Days: 6
Plan Fractions Treated to Date: 3
Plan Prescribed Dose Per Fraction: 5 Gy
Plan Total Fractions Prescribed: 5
Plan Total Prescribed Dose: 25 Gy
Reference Point Dosage Given to Date: 15 Gy
Reference Point Session Dosage Given: 5 Gy
Session Number: 3

## 2023-02-16 NOTE — Addendum Note (Signed)
Encounter addended by: Roel Cluck, RN on: 02/16/2023 2:53 PM  Actions taken: Clinical Note Signed

## 2023-02-16 NOTE — Progress Notes (Signed)
Pateint to clinic for 15 minutes observation SRS Brain.  Denies headache, fatigue, ringing in the ears, nausea, skin irritation, and no cognitive changes.  No Decadron order as of this visit.  Advised not to do anything strenuous for the next 24 hours and to call if develop side effects.  Vitals:  97.9-64-18-119/85 O2 sat 100% room air.  Ambulating out of clinic without assistance.

## 2023-02-18 ENCOUNTER — Other Ambulatory Visit: Payer: Self-pay

## 2023-02-18 ENCOUNTER — Ambulatory Visit
Admission: RE | Admit: 2023-02-18 | Discharge: 2023-02-18 | Disposition: A | Discharge status: Home / Self Care | Payer: Medicare PPO | Source: Ambulatory Visit | Attending: Radiation Oncology | Admitting: Radiation Oncology

## 2023-02-18 DIAGNOSIS — D320 Benign neoplasm of cerebral meninges: Secondary | ICD-10-CM | POA: Diagnosis not present

## 2023-02-18 LAB — RAD ONC ARIA SESSION SUMMARY
Course Elapsed Days: 8
Plan Fractions Treated to Date: 4
Plan Prescribed Dose Per Fraction: 5 Gy
Plan Total Fractions Prescribed: 5
Plan Total Prescribed Dose: 25 Gy
Reference Point Dosage Given to Date: 20 Gy
Reference Point Session Dosage Given: 5 Gy
Session Number: 4

## 2023-02-18 NOTE — Progress Notes (Signed)
Patient to clinic for observation 15 minutes SRS Brain.  Denies headache, fatigue, skin irritation, vision changes, nausea, and no cognitive changes.  Speech clear and audible.  No Decadron orders. Advised to avoid strenuous activity for 24 hours.  Vitals:  96.7-65-18-116/66 O2 sat 100%. Ambulating independently out of clinic.  Patient advised to call if need.

## 2023-02-20 ENCOUNTER — Other Ambulatory Visit: Payer: Self-pay

## 2023-02-20 ENCOUNTER — Ambulatory Visit
Admission: RE | Admit: 2023-02-20 | Discharge: 2023-02-20 | Disposition: A | Discharge status: Home / Self Care | Payer: Medicare PPO | Source: Ambulatory Visit | Attending: Radiation Oncology | Admitting: Radiation Oncology

## 2023-02-20 DIAGNOSIS — D320 Benign neoplasm of cerebral meninges: Secondary | ICD-10-CM | POA: Diagnosis not present

## 2023-02-20 LAB — RAD ONC ARIA SESSION SUMMARY
Course Elapsed Days: 10
Plan Fractions Treated to Date: 5
Plan Prescribed Dose Per Fraction: 5 Gy
Plan Total Fractions Prescribed: 5
Plan Total Prescribed Dose: 25 Gy
Reference Point Dosage Given to Date: 25 Gy
Reference Point Session Dosage Given: 5 Gy
Session Number: 5

## 2023-02-20 NOTE — Progress Notes (Signed)
Patient in for 15 minute observation denies fatigue headache, nausea, vision or ringing of ears, and no cognitive changes.  Speech clear.  No Decadron.  Vitals 97.3-77-20-135/94 O2 sat 99% room air.  Advised not to do anything strenuous for next 24 hours.  Ambulated out of the clinic without assistance.

## 2023-02-25 NOTE — Radiation Completion Notes (Addendum)
  Radiation Oncology         [phone] ________________________________  Name: [name] MRN: [id number]  Date: 02/20/2023  DOB: [birth year]  End of Treatment Note  Diagnosis:   72 yo woman with an enlarging symptomatic left petrous apex meningioma      Patient Name: Deborah Archer, Deborah Archer MRN: [id number] Date of Birth: 1951/06/30 Referring Physician: Courtney Paris, M.D. Date of Service: 2023-02-25 Radiation Oncologist: Margaretmary Bayley, M.D. [facility]   RADIATION ONCOLOGY END OF TREATMENT NOTE   Diagnosis: 72 yo woman with an enlarging symptomatic left petrous apex meningioma   Intent: Curative     ==========DELIVERED PLANS==========  First Treatment Date: 2023-02-10 - Last Treatment Date: 2023-02-20   Plan Name: Brain_SRT Site: Brain Technique: SBRT/SRT-IMRT Mode: Photon Dose Per Fraction: 5 Gy Prescribed Dose (Delivered / Prescribed): 25 Gy / 25 Gy Prescribed Fxs (Delivered / Prescribed): 5 / 5     ==========ON TREATMENT VISIT DATES========== 2023-02-10, 2023-02-12, 2023-02-16, 2023-02-18, 2023-02-20    See weekly On Treatment Notes is Epic for details.  She tolerated her treatments well without any acute ill side effects and was discharged home in stable condition following each treatment.  The patient will receive a call in about one month from the radiation oncology department. She will continue follow up with her neurosurgeon, Dr. Jordan Likes as well.  ------------------------------------------------   Margaretmary Dys, MD [facility]  Radiation Oncology Direct Dial: [phone]  Fax: [phone] [email or web address]  Skype  LinkedIn

## 2023-03-06 ENCOUNTER — Other Ambulatory Visit: Payer: Self-pay | Admitting: Radiation Therapy

## 2023-03-06 DIAGNOSIS — D329 Benign neoplasm of meninges, unspecified: Secondary | ICD-10-CM

## 2023-03-06 NOTE — Addendum Note (Signed)
Encounter addended by: Marcello Fennel, PA-C on: 03/06/2023 10:25 AM  Actions taken: Clinical Note Signed

## 2023-03-24 ENCOUNTER — Ambulatory Visit
Admission: RE | Admit: 2023-03-24 | Discharge: 2023-03-24 | Disposition: A | Discharge status: Home / Self Care | Payer: Medicare PPO | Source: Ambulatory Visit | Attending: Radiation Oncology | Admitting: Radiation Oncology

## 2023-03-24 DIAGNOSIS — D320 Benign neoplasm of cerebral meninges: Secondary | ICD-10-CM | POA: Insufficient documentation

## 2023-03-24 NOTE — Progress Notes (Signed)
  Radiation Oncology         [phone] ________________________________  Name: Deborah Archer MRN: [id number]  Date of Service: 03/24/2023  DOB: July 08, 1951  Post Treatment Telephone Note  Diagnosis:  72 yo woman with an enlarging symptomatic left petrous apex meningioma   (as documented in provider EOT note)   The patient was not available for call today.  The patient was counseled that she will be contacted by our brain and spine navigator to schedule surveillance imaging. The patient was encouraged to call if she have not received a call to schedule imaging, or if she develop concerns or questions regarding radiation. The patient will also continue to follow up with Dr. Jordan Likes in medical oncology.    Ruel Favors, LPN

## 2023-05-25 ENCOUNTER — Ambulatory Visit
Admission: RE | Admit: 2023-05-25 | Discharge: 2023-05-25 | Disposition: A | Discharge status: Home / Self Care | Payer: Medicare PPO | Source: Ambulatory Visit | Attending: Radiation Oncology | Admitting: Radiation Oncology

## 2023-05-25 DIAGNOSIS — D329 Benign neoplasm of meninges, unspecified: Secondary | ICD-10-CM

## 2023-05-25 MED ORDER — GADOPICLENOL 0.5 MMOL/ML IV SOLN
8.0000 mL | Freq: Once | INTRAVENOUS | Status: AC | PRN
Start: 2023-05-25 — End: 2023-05-25
  Administered 2023-05-25: 8 mL via INTRAVENOUS

## 2023-05-27 ENCOUNTER — Encounter: Payer: Self-pay | Admitting: Genetic Counselor

## 2024-05-19 ENCOUNTER — Other Ambulatory Visit: Payer: Self-pay | Admitting: Neurosurgery

## 2024-05-19 DIAGNOSIS — D329 Benign neoplasm of meninges, unspecified: Secondary | ICD-10-CM

## 2024-06-19 ENCOUNTER — Ambulatory Visit
Admission: RE | Admit: 2024-06-19 | Discharge: 2024-06-19 | Disposition: A | Discharge status: Home / Self Care | Source: Ambulatory Visit | Attending: Neurosurgery | Admitting: Neurosurgery

## 2024-06-19 DIAGNOSIS — D329 Benign neoplasm of meninges, unspecified: Secondary | ICD-10-CM

## 2024-06-19 MED ORDER — GADOPICLENOL 0.5 MMOL/ML IV SOLN
7.0000 mL | Freq: Once | INTRAVENOUS | Status: AC | PRN
  Administered 2024-06-19: 7 mL via INTRAVENOUS
